# Patient Record
Sex: Female | Born: 1965 | Race: White | Hispanic: No | Smoking: Never smoker
Health system: Southern US, Community
[De-identification: ages and names within clinical notes are randomized; demographics above are authoritative.]

## PROBLEM LIST (undated history)

## (undated) DIAGNOSIS — I639 Cerebral infarction, unspecified: Secondary | ICD-10-CM

## (undated) DIAGNOSIS — E119 Type 2 diabetes mellitus without complications: Secondary | ICD-10-CM

---

## 2021-07-06 ENCOUNTER — Encounter (HOSPITAL_BASED_OUTPATIENT_CLINIC_OR_DEPARTMENT_OTHER): Payer: Self-pay | Admitting: Emergency Medicine

## 2021-07-06 ENCOUNTER — Emergency Department (HOSPITAL_BASED_OUTPATIENT_CLINIC_OR_DEPARTMENT_OTHER): Payer: Self-pay

## 2021-07-06 ENCOUNTER — Other Ambulatory Visit: Payer: Self-pay

## 2021-07-06 ENCOUNTER — Emergency Department (HOSPITAL_BASED_OUTPATIENT_CLINIC_OR_DEPARTMENT_OTHER)
Admission: EM | Admit: 2021-07-06 | Discharge: 2021-07-06 | Disposition: A | Discharge status: Home / Self Care | Payer: Self-pay | Attending: Emergency Medicine | Admitting: Emergency Medicine

## 2021-07-06 DIAGNOSIS — R1011 Right upper quadrant pain: Secondary | ICD-10-CM

## 2021-07-06 DIAGNOSIS — R1084 Generalized abdominal pain: Secondary | ICD-10-CM | POA: Insufficient documentation

## 2021-07-06 DIAGNOSIS — R112 Nausea with vomiting, unspecified: Secondary | ICD-10-CM | POA: Insufficient documentation

## 2021-07-06 DIAGNOSIS — R197 Diarrhea, unspecified: Secondary | ICD-10-CM | POA: Insufficient documentation

## 2021-07-06 DIAGNOSIS — E119 Type 2 diabetes mellitus without complications: Secondary | ICD-10-CM | POA: Insufficient documentation

## 2021-07-06 HISTORY — DX: Type 2 diabetes mellitus without complications: E11.9

## 2021-07-06 LAB — URINALYSIS, ROUTINE W REFLEX MICROSCOPIC
Bilirubin Urine: NEGATIVE
Glucose, UA: >=500 mg/dL — AB
Hgb urine dipstick: NEGATIVE
Ketones, ur: NEGATIVE mg/dL
Leukocytes,Ua: NEGATIVE
Nitrite: NEGATIVE
Protein, ur: NEGATIVE mg/dL
Specific Gravity, Urine: 1.015 (ref 1.005–1.030)
pH: 6 (ref 5.0–8.0)

## 2021-07-06 LAB — COMPREHENSIVE METABOLIC PANEL
ALT: 13 U/L (ref 0–44)
AST: 14 U/L — ABNORMAL LOW (ref 15–41)
Albumin: 3.9 g/dL (ref 3.5–5.0)
Alkaline Phosphatase: 90 U/L (ref 38–126)
Anion gap: 10 (ref 5–15)
BUN: 11 mg/dL (ref 6–20)
CO2: 27 mmol/L (ref 22–32)
Calcium: 9 mg/dL (ref 8.9–10.3)
Chloride: 96 mmol/L — ABNORMAL LOW (ref 98–111)
Creatinine, Ser: 0.59 mg/dL (ref 0.44–1.00)
GFR, Estimated: >60 mL/min (ref >60)
Glucose, Bld: 418 mg/dL — ABNORMAL HIGH (ref 70–99)
Potassium: 4.2 mmol/L (ref 3.5–5.1)
Sodium: 133 mmol/L — ABNORMAL LOW (ref 135–145)
Total Bilirubin: 0.5 mg/dL (ref 0.3–1.2)
Total Protein: 7.9 g/dL (ref 6.5–8.1)

## 2021-07-06 LAB — CBG MONITORING, ED
Glucose-Capillary: 407 mg/dL — ABNORMAL HIGH (ref 70–99)
Glucose-Capillary: 308 mg/dL — ABNORMAL HIGH (ref 70–99)

## 2021-07-06 LAB — URINALYSIS, MICROSCOPIC (REFLEX)

## 2021-07-06 LAB — CBC
HCT: 38.1 % (ref 36.0–46.0)
Hemoglobin: 13.1 g/dL (ref 12.0–15.0)
MCH: 30.3 pg (ref 26.0–34.0)
MCHC: 34.4 g/dL (ref 30.0–36.0)
MCV: 88.2 fL (ref 80.0–100.0)
Platelets: 331 10*3/uL (ref 150–400)
RBC: 4.32 MIL/uL (ref 3.87–5.11)
RDW: 11.4 % — ABNORMAL LOW (ref 11.5–15.5)
WBC: 10.7 10*3/uL — ABNORMAL HIGH (ref 4.0–10.5)
nRBC: 0 % (ref 0.0–0.2)

## 2021-07-06 LAB — LIPASE, BLOOD: Lipase: 18 U/L (ref 11–51)

## 2021-07-06 IMAGING — CR DG CHEST 2V
2 series · 2 of 2 positions shown · non-contrast
Comparison: None.

CLINICAL DATA: concern for post covid pna

EXAM:
CHEST - 2 VIEW

[w chest pa]
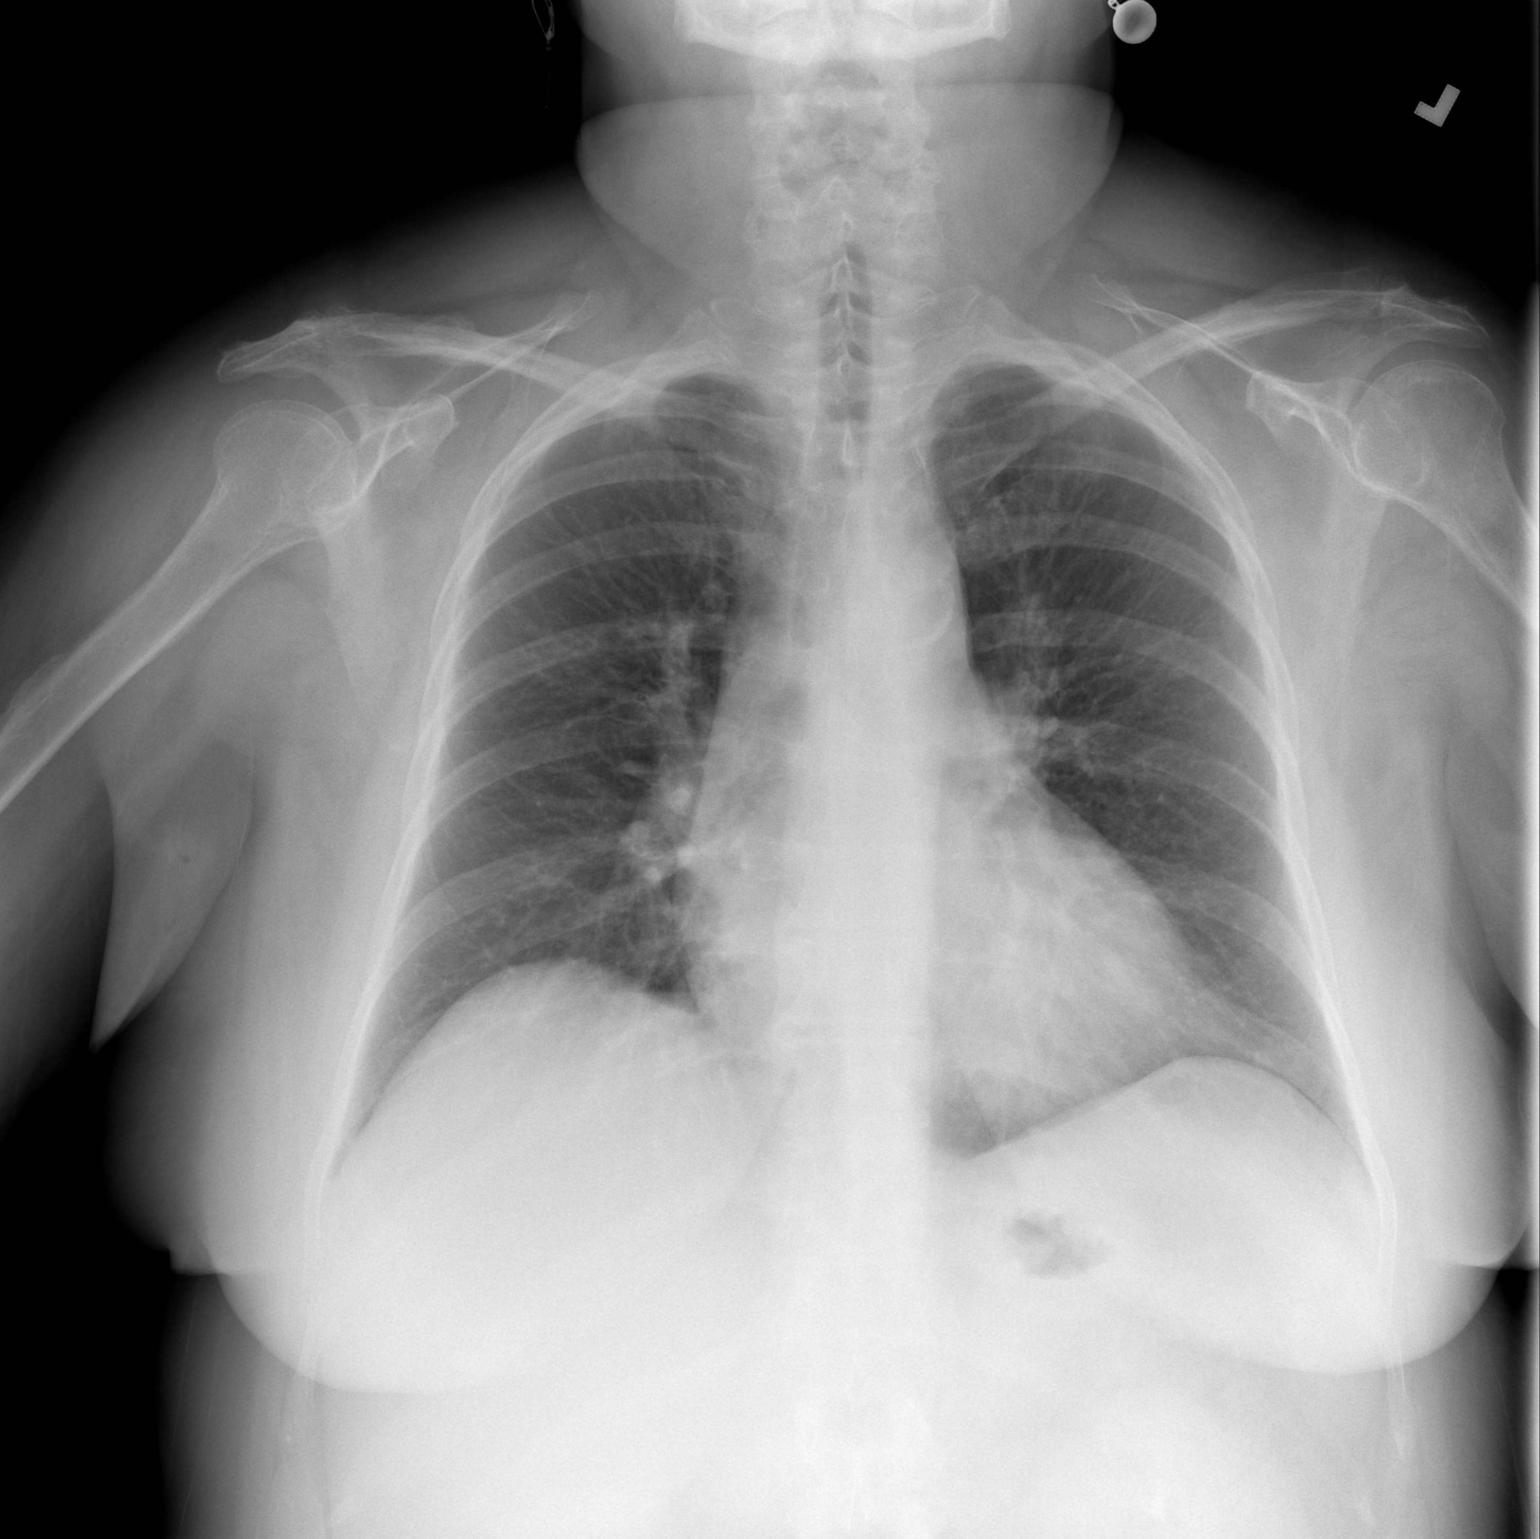

[w chest lat]
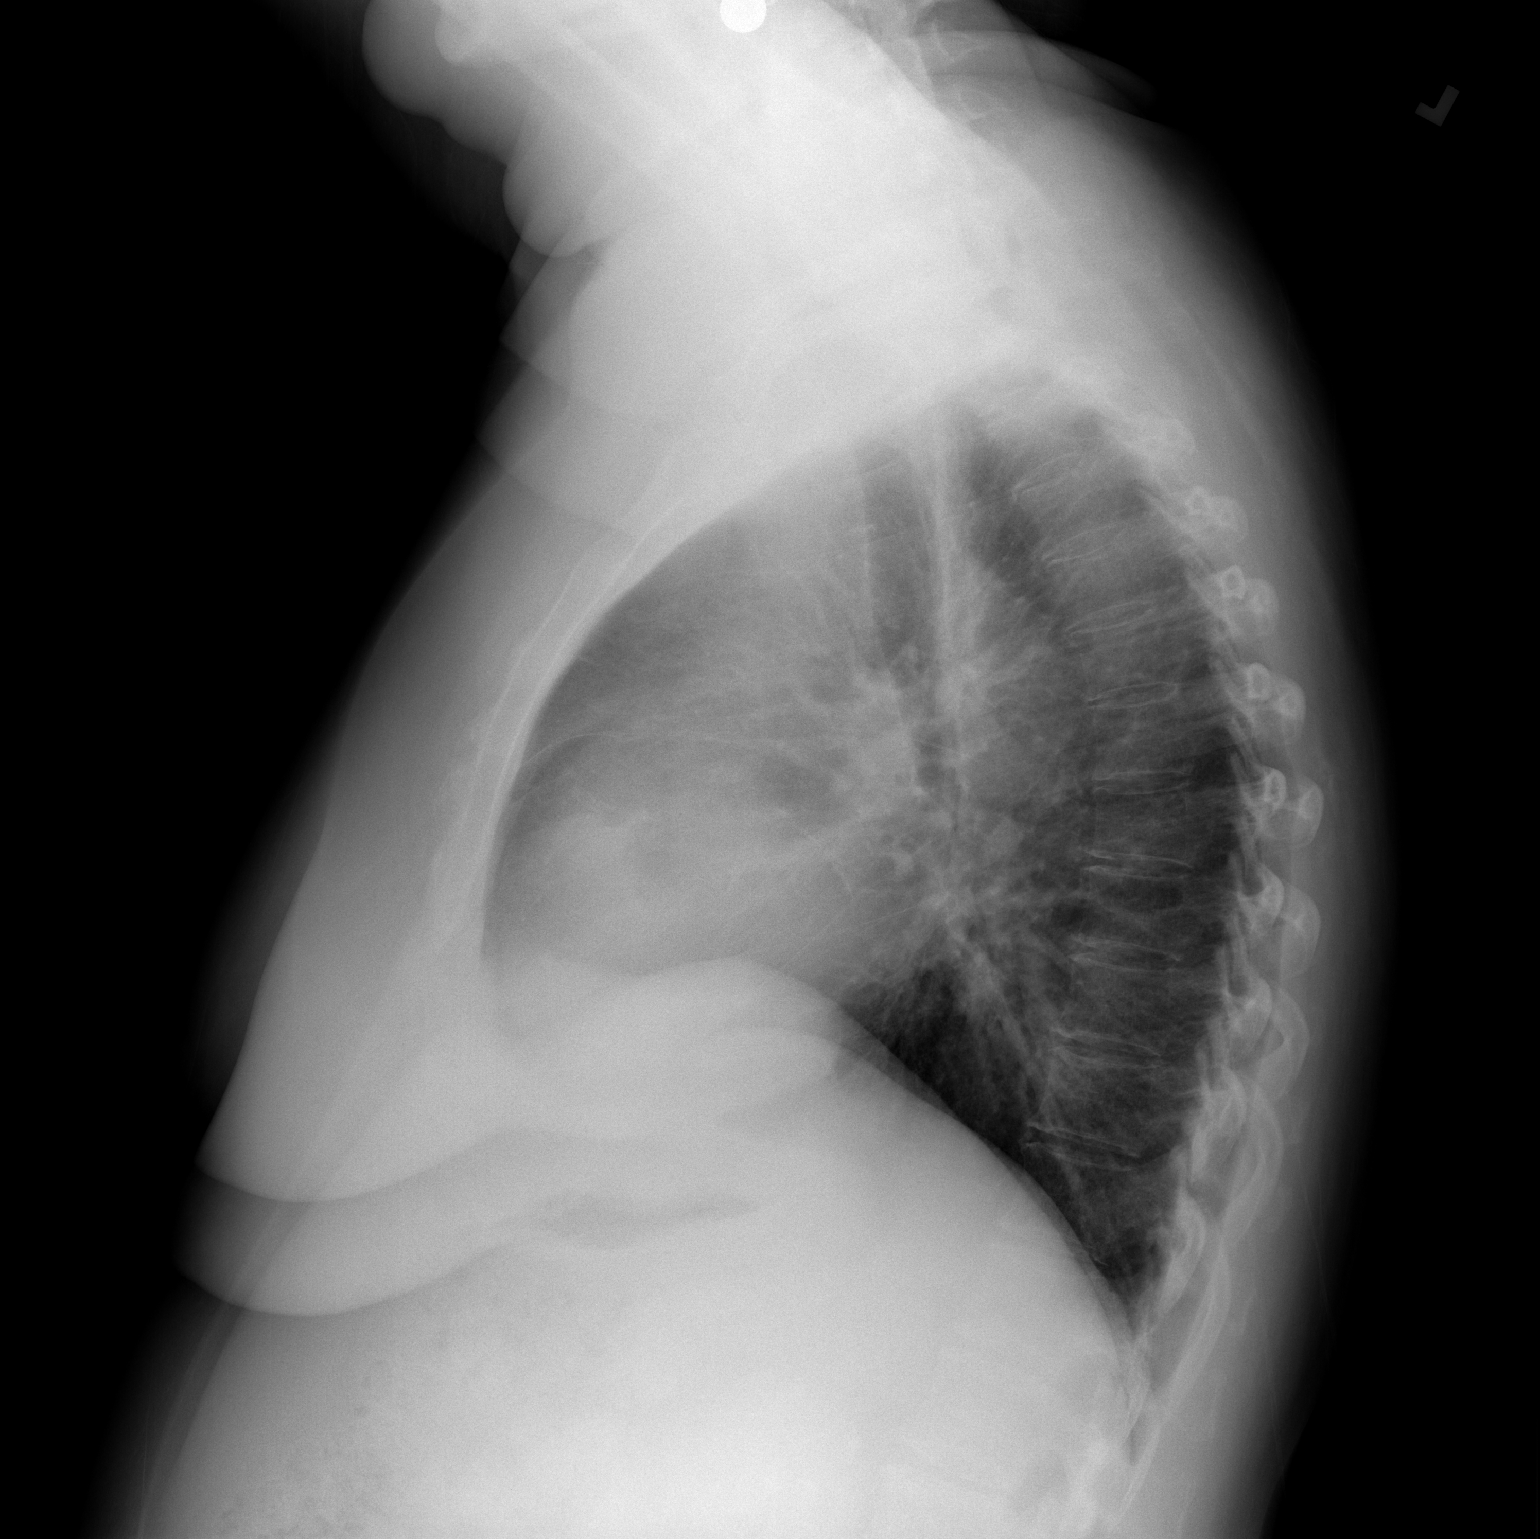

[2 of 2 positions shown; findings below may reference images not displayed]

FINDINGS: The cardiomediastinal silhouette is within normal limits. No pleural
effusion. No pneumothorax. No mass or consolidation. No acute
osseous abnormality.
IMPRESSION: No acute abnormality in the chest.  No consolidative pneumonia.

## 2021-07-06 IMAGING — CT CT ABD-PELV W/ CM
2 of 5 series · 16 of 46 positions shown, 18 images · IV contrast (Omnipaque)
Comparison: None.

CLINICAL DATA: Right upper quadrant abdominal pain.

EXAM:
CT ABDOMEN AND PELVIS WITH CONTRAST
TECHNIQUE: Multidetector CT imaging of the abdomen and pelvis was performed
using the standard protocol following bolus administration of
intravenous contrast.
CONTRAST:  85mL OMNIPAQUE IOHEXOL 350 MG/ML SOLN

[Series 2: axial st · axial · 0.91mm/px · z∈[-540,-85]mm · 13 of 103 slices shown, 15 images]
[im 6/103  soft-tissue]
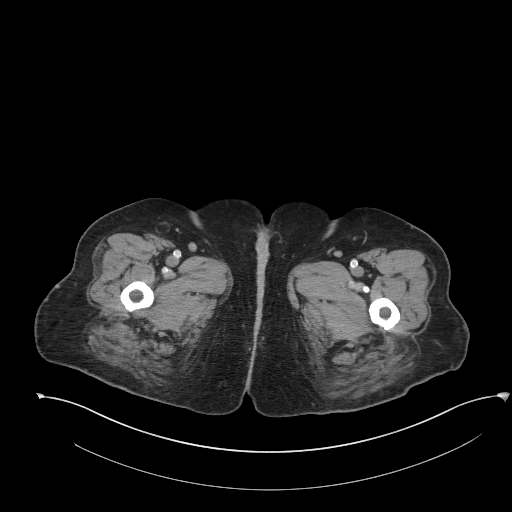
[im 6/103  bone]
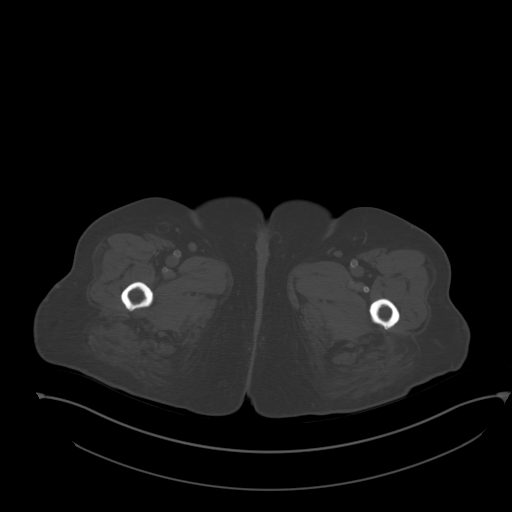
[im 17/103  soft-tissue]
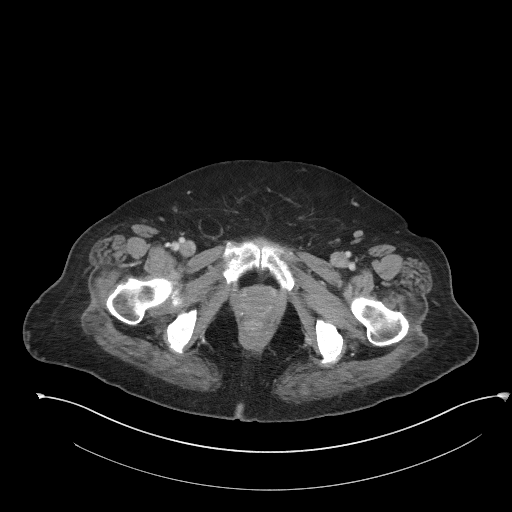
[im 22/103  soft-tissue]
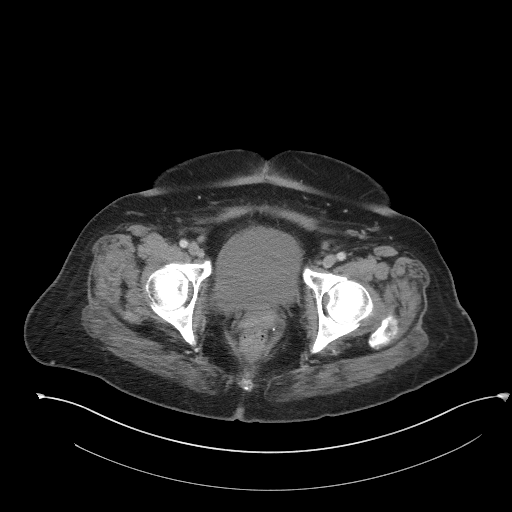
[im 27/103  soft-tissue]
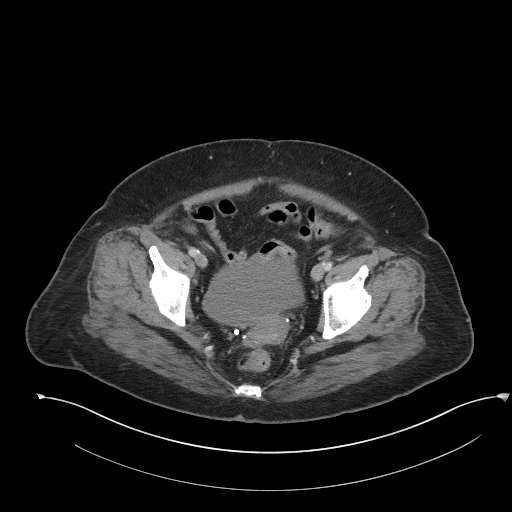
[im 38/103  soft-tissue]
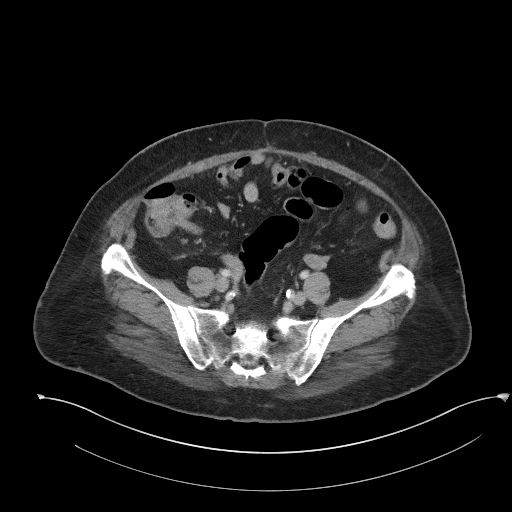
[im 43/103  soft-tissue]
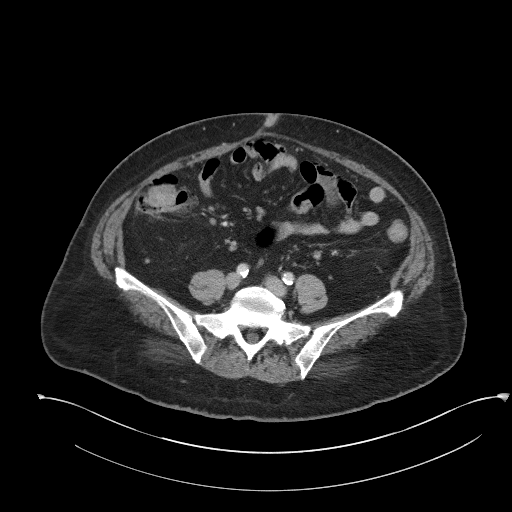
[im 54/103  soft-tissue]
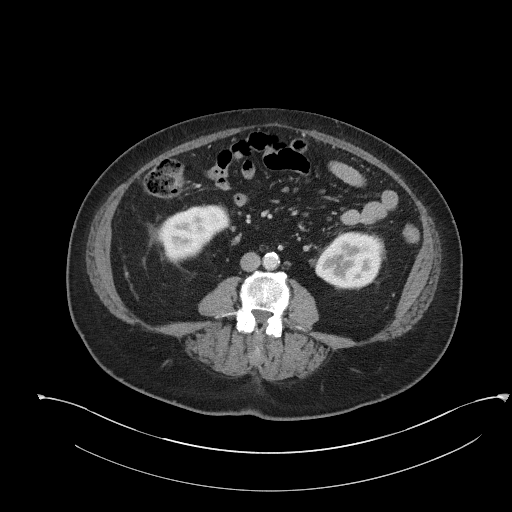
[im 60/103  soft-tissue]
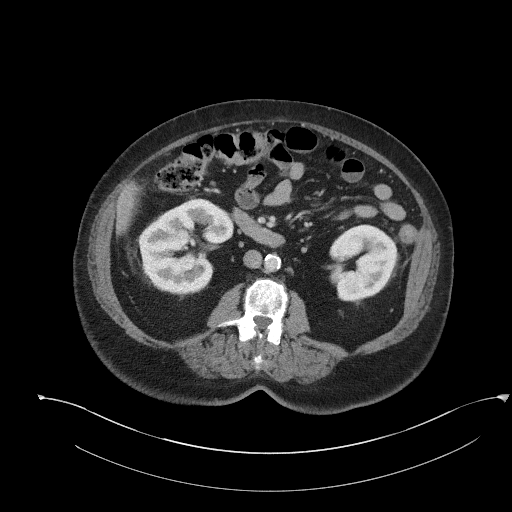
[im 65/103  soft-tissue]
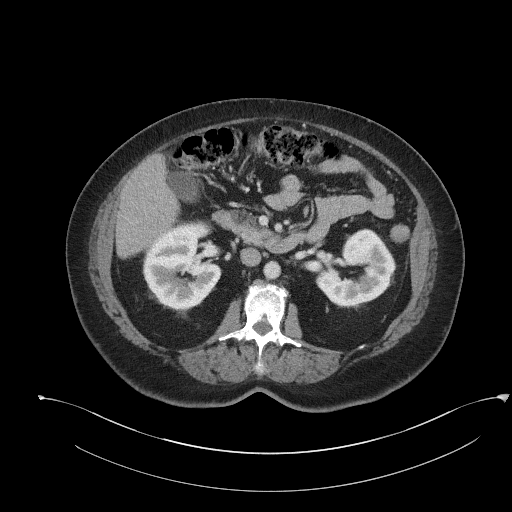
[im 65/103  bone]
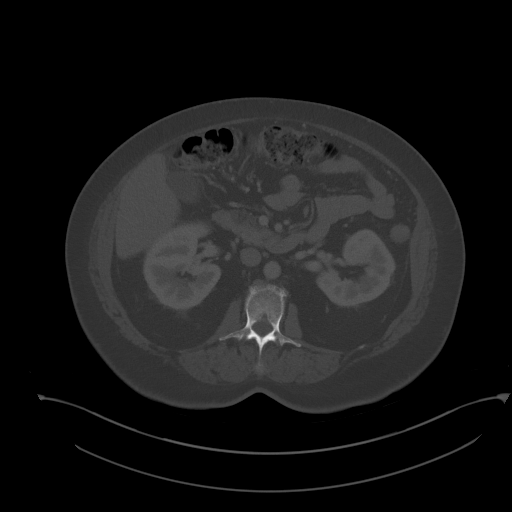
[im 76/103  soft-tissue]
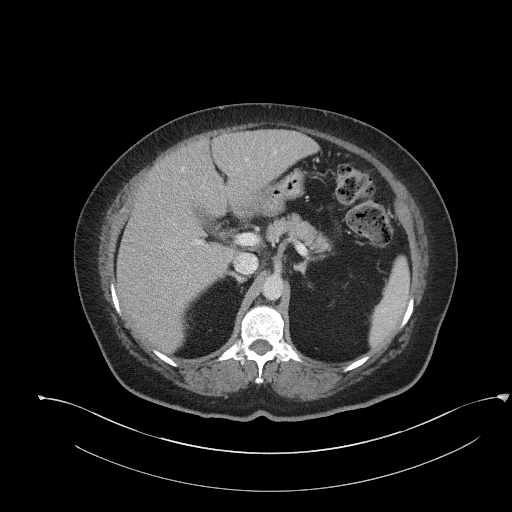
[im 81/103  soft-tissue]
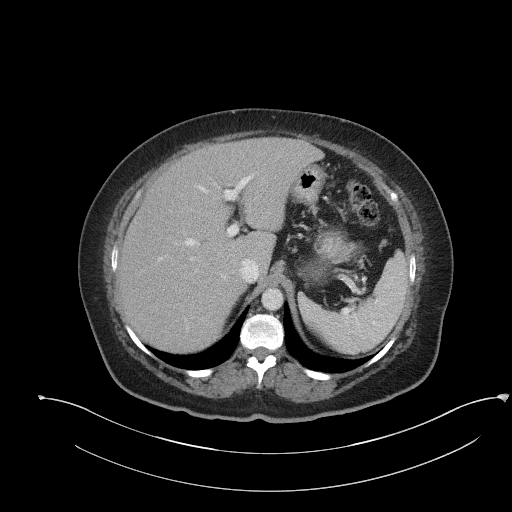
[im 86/103  soft-tissue]
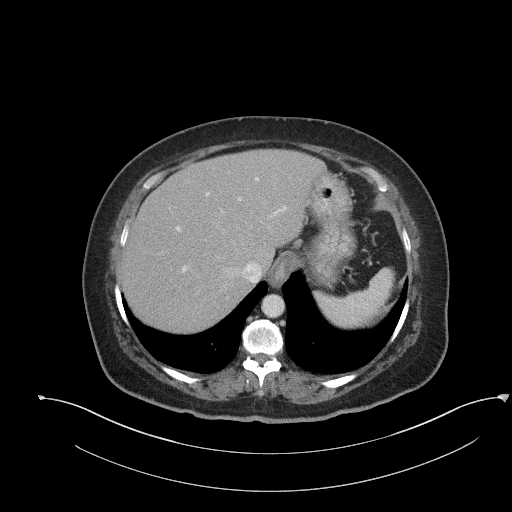
[im 97/103  soft-tissue]
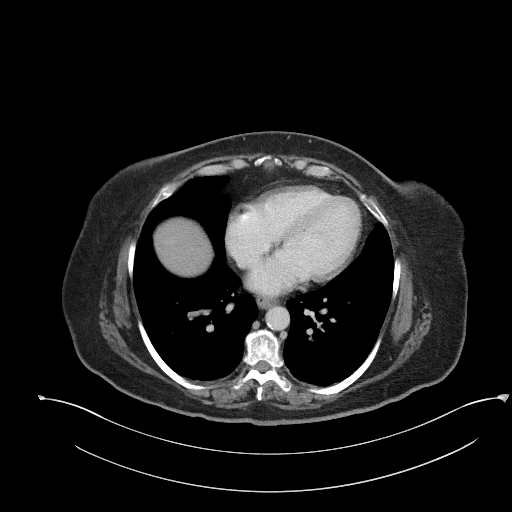

[Series 5: coronal st · coronal · 0.82mm/px · 3 of 100 slices shown]
[im 34/100  soft-tissue]
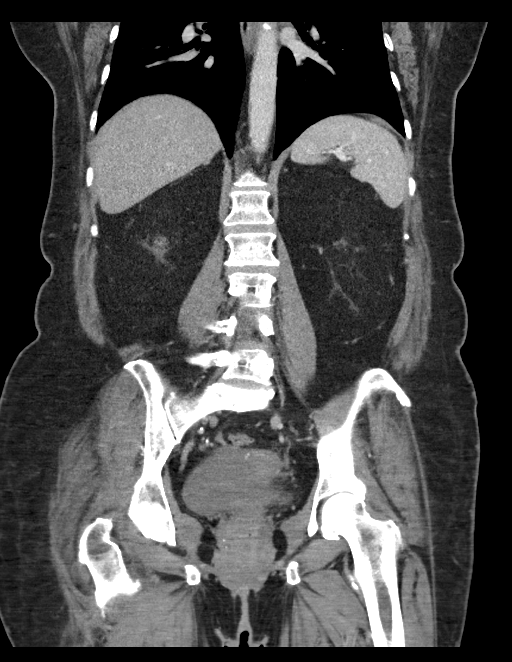
[im 45/100  soft-tissue]
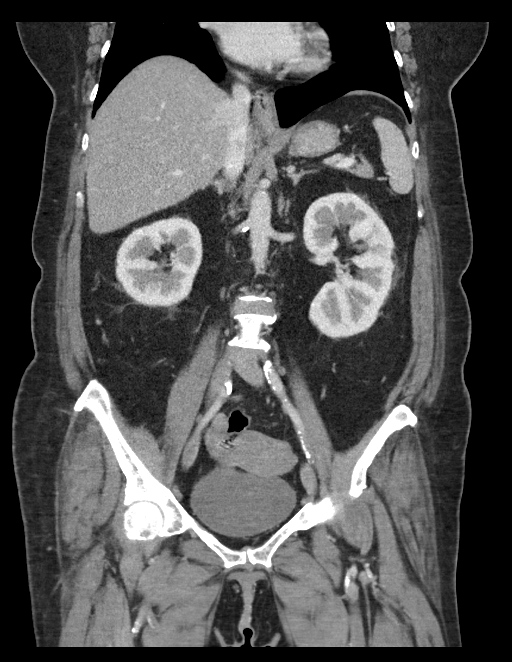
[im 56/100  soft-tissue]
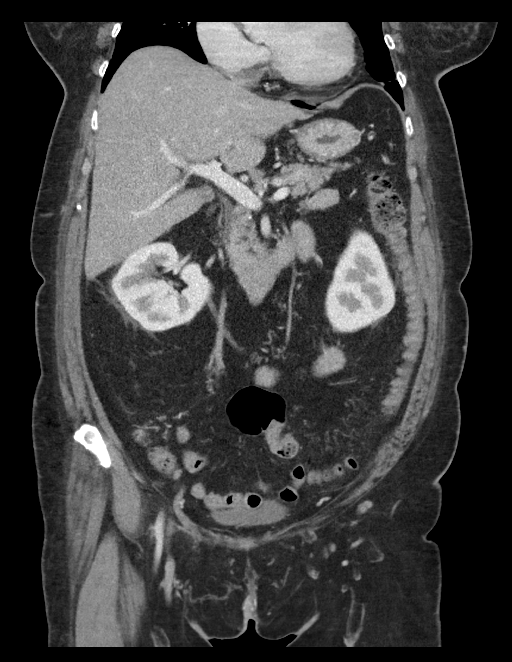

[16 of 46 positions shown; findings below may reference images not displayed]

FINDINGS: Lower chest: No acute abnormality.

Hepatobiliary: No focal liver abnormality is seen. No gallstones,
gallbladder wall thickening, or biliary dilatation.

Pancreas: Unremarkable. No pancreatic ductal dilatation or
surrounding inflammatory changes.

Spleen: Normal in size without focal abnormality.

Adrenals/Urinary Tract: Adrenal glands are unremarkable. Kidneys are
normal, without renal calculi, focal lesion, or hydronephrosis.
Bladder is unremarkable.

Stomach/Bowel: Stomach is within normal limits. Appendix appears
normal. No evidence of bowel wall thickening, distention, or
inflammatory changes. There is sigmoid colon diverticulosis without
evidence for acute diverticulitis.

Vascular/Lymphatic: Aortic atherosclerosis. No enlarged abdominal or
pelvic lymph nodes.

Reproductive: There surgical clips in the bilateral adnexa. Ovaries
and uterus are otherwise unremarkable.

Other: There is no ascites. There is a small right fat containing
inguinal hernia.

Musculoskeletal: No acute or significant osseous findings.
IMPRESSION: 1. No acute localizing process in the abdomen or pelvis.
2. Sigmoid colon diverticulosis without evidence for acute
diverticulitis.
3. Small right fat containing inguinal hernia.

## 2021-07-06 MED ORDER — ACETAMINOPHEN 325 MG PO TABS
650.0000 mg | ORAL_TABLET | Freq: Once | ORAL | Status: AC
  Administered 2021-07-06: 650 mg via ORAL
  Filled 2021-07-06: qty 2

## 2021-07-06 MED ORDER — INSULIN ASPART 100 UNIT/ML IJ SOLN
5.0000 [IU] | Freq: Once | INTRAMUSCULAR | Status: AC
  Administered 2021-07-06: 5 [IU] via SUBCUTANEOUS

## 2021-07-06 MED ORDER — METOCLOPRAMIDE HCL 5 MG/ML IJ SOLN
10.0000 mg | Freq: Once | INTRAMUSCULAR | Status: AC
  Administered 2021-07-06: 10 mg via INTRAVENOUS
  Filled 2021-07-06: qty 2

## 2021-07-06 MED ORDER — LACTATED RINGERS IV BOLUS
1000.0000 mL | Freq: Once | INTRAVENOUS | Status: AC
  Administered 2021-07-06: 1000 mL via INTRAVENOUS

## 2021-07-06 MED ORDER — INSULIN REGULAR HUMAN 100 UNIT/ML IJ SOLN: 5.0000 [IU] | Freq: Once | INTRAMUSCULAR | Status: DC

## 2021-07-06 MED ORDER — DICYCLOMINE HCL 20 MG PO TABS: 20.0000 mg | ORAL_TABLET | Freq: Two times a day (BID) | ORAL | 0 refills | Status: DC

## 2021-07-06 MED ORDER — DIPHENHYDRAMINE HCL 25 MG PO CAPS
25.0000 mg | ORAL_CAPSULE | Freq: Once | ORAL | Status: AC
  Administered 2021-07-06: 25 mg via ORAL
  Filled 2021-07-06: qty 1

## 2021-07-06 MED ORDER — IOHEXOL 350 MG/ML SOLN
100.0000 mL | Freq: Once | INTRAVENOUS | Status: AC | PRN
  Administered 2021-07-06: 85 mL via INTRAVENOUS

## 2021-07-06 MED ORDER — DICYCLOMINE HCL 10 MG PO CAPS
20.0000 mg | ORAL_CAPSULE | Freq: Once | ORAL | Status: AC
  Administered 2021-07-06: 20 mg via ORAL
  Filled 2021-07-06: qty 2

## 2021-07-06 MED ORDER — METOCLOPRAMIDE HCL 10 MG PO TABS: 10.0000 mg | ORAL_TABLET | Freq: Four times a day (QID) | ORAL | 0 refills | Status: DC

## 2021-07-06 NOTE — ED Provider Notes (Signed)
[facility] EMERGENCY DEPARTMENT Provider Note   CSN: [id number] Arrival date & time: 07/06/21  1117     History Chief Complaint  Patient presents with   Abdominal Pain    Savannah Conner is a 55 y.o. female.  HPI Patient is a 55 year old female with past medical history sniffing for DM2  She is presented to ER today with symptoms of nausea vomiting diarrhea abdominal pain, abdominal bloating, polyuria fatigue for the past 3 days.  She states that she was diagnosed with COVID-19 on 13th has not been on any antiviral medication states that she has been taking all of her medications as prescribed until 3 days ago she stop taking her metformin because she was having abdominal bloating and diarrhea already and did not wish to make this worse.  She states that her blood sugars have trended upwards until they reached mid 400s.  She endorses polyuria without dysuria hematuria. Also denies any vaginal discharge states that there is no blood in her stool denies any fevers she has taken no antipyretics either.  She has taken no other medications for her symptoms  No chest pain or shortness of breath.     Past Medical History:  Diagnosis Date   Diabetes mellitus without complication (HCC)     There are no problems to display for this patient.   The histories are not reviewed yet. Please review them in the "History" navigator section and refresh this SmartLink.   OB History   No obstetric history on file.     No family history on file.  Social History   Tobacco Use   Smoking status: Never   Smokeless tobacco: Never  Substance Use Topics   Alcohol use: Never   Drug use: Never    Home Medications Prior to Admission medications   Medication Sig Start Date End Date Taking? Authorizing Provider  dicyclomine (BENTYL) 20 MG tablet Take 1 tablet (20 mg total) by mouth 2 (two) times daily. 07/06/21  Yes Myron Stankovich S, PA  metoCLOPramide (REGLAN) 10 MG tablet Take 1  tablet (10 mg total) by mouth every 6 (six) hours. 07/06/21  Yes Solon Augusta S, PA    Allergies    Codeine  Review of Systems   Review of Systems  Constitutional:  Positive for fatigue. Negative for chills and fever.  HENT:  Negative for congestion.   Eyes:  Negative for pain.  Respiratory:  Negative for cough and shortness of breath.   Cardiovascular:  Negative for chest pain and leg swelling.  Gastrointestinal:  Positive for abdominal pain, diarrhea, nausea and vomiting.  Genitourinary:  Negative for dysuria.  Musculoskeletal:  Negative for myalgias.  Skin:  Negative for rash.  Neurological:  Negative for dizziness and headaches.   Physical Exam Updated Vital Signs BP (!) 157/90   Pulse 76   Temp 98.5 F (36.9 C) (Oral)   Resp 16   Ht 5\' 2"  (1.575 m)   Wt 77.1 kg   SpO2 99%   BMI 31.09 kg/m   Physical Exam Vitals and nursing note reviewed.  Constitutional:      General: She is not in acute distress.    Appearance: She is obese.     Comments: Appears uncomfortable but in no acute distress.  Pleasant 55 year old female able answer questions properly follow commands.  Speaking in full sentences no tachypnea.  HENT:     Head: Normocephalic and atraumatic.     Nose: Nose normal.  Eyes:  General: No scleral icterus. Cardiovascular:     Rate and Rhythm: Normal rate and regular rhythm.     Pulses: Normal pulses.     Heart sounds: Normal heart sounds.  Pulmonary:     Effort: Pulmonary effort is normal. No respiratory distress.     Breath sounds: No wheezing.  Abdominal:     Palpations: Abdomen is soft.     Tenderness: There is abdominal tenderness.     Comments: Mild diffuse tenderness palpation of the lower abdomen.  No guarding or rebound.  Right lower quadrant seems to be more tender than left. No CVA tenderness.  Musculoskeletal:     Cervical back: Normal range of motion.     Right lower leg: No edema.     Left lower leg: No edema.     Comments:  Paravertebral muscular tenderness on both left and right side of midline lumbar spine.  No midline lumbar tenderness palpation no step-off deformity bruising.  Skin:    General: Skin is warm and dry.     Capillary Refill: Capillary refill takes less than 2 seconds.  Neurological:     Mental Status: She is alert. Mental status is at baseline.  Psychiatric:        Mood and Affect: Mood normal.        Behavior: Behavior normal.    ED Results / Procedures / Treatments   Labs (all labs ordered are listed, but only abnormal results are displayed) Labs Reviewed  COMPREHENSIVE METABOLIC PANEL - Abnormal; Notable for the following components:      Result Value   Sodium 133 (*)    Chloride 96 (*)    Glucose, Bld 418 (*)    AST 14 (*)    All other components within normal limits  CBC - Abnormal; Notable for the following components:   WBC 10.7 (*)    RDW 11.4 (*)    All other components within normal limits  URINALYSIS, ROUTINE W REFLEX MICROSCOPIC - Abnormal; Notable for the following components:   Glucose, UA >=500 (*)    All other components within normal limits  URINALYSIS, MICROSCOPIC (REFLEX) - Abnormal; Notable for the following components:   Bacteria, UA RARE (*)    All other components within normal limits  CBG MONITORING, ED - Abnormal; Notable for the following components:   Glucose-Capillary 407 (*)    All other components within normal limits  CBG MONITORING, ED - Abnormal; Notable for the following components:   Glucose-Capillary 308 (*)    All other components within normal limits  LIPASE, BLOOD    EKG None  Radiology DG Chest 2 View  Result Date: 07/06/2021 CLINICAL DATA:  concern for post covid pna EXAM: CHEST - 2 VIEW COMPARISON:  None. FINDINGS: The cardiomediastinal silhouette is within normal limits. No pleural effusion. No pneumothorax. No mass or consolidation. No acute osseous abnormality. IMPRESSION: No acute abnormality in the chest.  No consolidative  pneumonia. Electronically Signed   By: Olive Bass M.D.   On: 07/06/2021 15:36   CT ABDOMEN PELVIS W CONTRAST  Result Date: 07/06/2021 CLINICAL DATA:  Right upper quadrant abdominal pain. EXAM: CT ABDOMEN AND PELVIS WITH CONTRAST TECHNIQUE: Multidetector CT imaging of the abdomen and pelvis was performed using the standard protocol following bolus administration of intravenous contrast. CONTRAST:  61mL OMNIPAQUE IOHEXOL 350 MG/ML SOLN COMPARISON:  None. FINDINGS: Lower chest: No acute abnormality. Hepatobiliary: No focal liver abnormality is seen. No gallstones, gallbladder wall thickening, or biliary dilatation. Pancreas: Unremarkable.  No pancreatic ductal dilatation or surrounding inflammatory changes. Spleen: Normal in size without focal abnormality. Adrenals/Urinary Tract: Adrenal glands are unremarkable. Kidneys are normal, without renal calculi, focal lesion, or hydronephrosis. Bladder is unremarkable. Stomach/Bowel: Stomach is within normal limits. Appendix appears normal. No evidence of bowel wall thickening, distention, or inflammatory changes. There is sigmoid colon diverticulosis without evidence for acute diverticulitis. Vascular/Lymphatic: Aortic atherosclerosis. No enlarged abdominal or pelvic lymph nodes. Reproductive: There surgical clips in the bilateral adnexa. Ovaries and uterus are otherwise unremarkable. Other: There is no ascites. There is a small right fat containing inguinal hernia. Musculoskeletal: No acute or significant osseous findings. IMPRESSION: 1. No acute localizing process in the abdomen or pelvis. 2. Sigmoid colon diverticulosis without evidence for acute diverticulitis. 3. Small right fat containing inguinal hernia. Electronically Signed   By: Darliss Cheney M.D.   On: 07/06/2021 15:26    Procedures Procedures   Medications Ordered in ED Medications  lactated ringers bolus 1,000 mL (0 mLs Intravenous Stopped 07/06/21 1501)  acetaminophen (TYLENOL) tablet 650 mg (650  mg Oral Given 07/06/21 1330)  dicyclomine (BENTYL) capsule 20 mg (20 mg Oral Given 07/06/21 1330)  metoCLOPramide (REGLAN) injection 10 mg (10 mg Intravenous Given 07/06/21 1330)  diphenhydrAMINE (BENADRYL) capsule 25 mg (25 mg Oral Given 07/06/21 1330)  insulin aspart (novoLOG) injection 5 Units (5 Units Subcutaneous Given 07/06/21 1500)  iohexol (OMNIPAQUE) 350 MG/ML injection 100 mL (85 mLs Intravenous Contrast Given 07/06/21 1442)    ED Course  I have reviewed the triage vital signs and the nursing notes.  Pertinent labs & imaging results that were available during my care of the patient were reviewed by me and considered in my medical decision making (see chart for details).  Clinical Course as of 07/07/21 1633  Thu Jul 06, 2021  1535 CT abdomen pelvis with contrast without acute abnormality small fat-containing hernia in right lower abdomen and diverticulosis without diverticulitis. [WF]  1541 Chest x-ray unremarkable. [WF]  1541 CBC with very mild leukocytosis of 10.7.  No anemia. [WF]  1541 CMP notable for mild hypoNa likely due to hyperglycemia this corrects to normal with correction.  Normal kidney function. [WF]  1541 Urinalysis, Routine w reflex microscopic Urine, Clean Catch(!) [WF]  1541 Glucose, UA(!): >=500 [WF]  1541 Lipase has normalized of pancreatitis.   I personally reviewed all laboratory work and imaging.  Metabolic panel without any acute abnormality specifically kidney function within normal limits and no significant electrolyte abnormalities. CBC without significant leukocytosis or significant anemia.  [WF]    Clinical Course User Index [WF] Gailen Shelter, [place]   MDM Rules/Calculators/A&P                          Patient is a 55 year old female with a history of DM2 and she is 9 days s/p diagnosis of COVID-19 seems to have been dramatically improving with her symptoms without antiviral treatment she is vaccinated  She comes the ER today for the past 3 days of  symptoms of nausea vomiting diarrhea she denies any fevers at home she has not had any recent travel no recent antibiotic use she has not had any medication changes she has stopped taking her metformin because of her diarrhea and her blood sugar is decreased to 418 here in the ER  She is endorsing some polyuria but no other urinary symptoms  On physical exam patient does have some tenderness to palpation of the lower abdomen specifically the  right lower abdomen no guarding or rebound of a relatively low suspicion for acute appendicitis we will provide patient with fluids, analgesia, small dose of insulin and Bentyl as well as nausea medicine and reassess.  Patient CMP is notable for mild hyponatremia which corrects to normal with glucose correction.  Mild hyperchloremia anemia.  No anion gap.  Lipase within normal limits at pancreatitis urinalysis negative for nitrates or leukocytes doubt urinary tract infection she does have glucosuria consistent with her hyperglycemia likely calling her polyuria Mild leukocytosis 10.7 chest x-ray unremarkable.  On reassessment patient continues to have right lower quadrant abdominal tenderness with palpation.  Show decision-making rotation patient she is agreeable to CT imaging at this time.  CT abdomen pelvis shows diverticulosis without diverticulitis there is also a small right-sided fat-containing inguinal hernia no other abnormalities Agree of radiology read.  I personally reviewed all labs and imaging from his encounter.  On my reassessment patient states she feels no longer any nausea States that her pain is now almost completely resolved although she does have some mild tenderness in the right lower quadrant still With reassuring CT imaging labs and work-up today however significant improvement in symptoms we will discharge patient home with Benadryl and Reglan to take with Bentyl.  Suspect viral gastroenteritis.  Unlikely to be related to her COVID-19  since her other symptoms seem to be dramatically improving.  She will follow-up with PCP return precautions given and understood.  Patient ambulatory and tolerating p.o. at time of discharge.   Final Clinical Impression(s) / ED Diagnoses Final diagnoses:  Generalized abdominal pain  RUQ abdominal pain  Nausea vomiting and diarrhea    Rx / DC Orders ED Discharge Orders          Ordered    dicyclomine (BENTYL) 20 MG tablet  2 times daily        07/06/21 1626    metoCLOPramide (REGLAN) 10 MG tablet  Every 6 hours        07/06/21 1626             Solon Augusta [place], [place] 07/07/21 1652    Terald Sleeper, MD 07/08/21 [phone]

## 2021-07-06 NOTE — Discharge Instructions (Addendum)
The CT scan of your abdomen pelvis and your chest x-ray were reassuringly normal apart from a small fat-containing hernia in the right abdomen which is unlikely to be causing your symptoms currently  Your work-up today overall is without any significant abnormalities.  I am discharging home with medication called Reglan we will take with Benadryl as needed for headaches and nausea.  Please drink plenty of water.  Follow-up with your primary care provider return to the ER for any new or concerning symptoms. I have also prescribed you Bentyl for abdominal pain

## 2021-07-06 NOTE — ED Triage Notes (Signed)
Reports she was diagnosed with covid on the 13th.  Was better but now having abdominal pain, bloating, diarrhea.  Also reports cbg elevated at 483 at the UC.  Is a diabetic but has not taken metformin all week due to gi upset.

## 2022-01-29 ENCOUNTER — Emergency Department (HOSPITAL_COMMUNITY): Payer: Self-pay

## 2022-01-29 ENCOUNTER — Observation Stay (HOSPITAL_BASED_OUTPATIENT_CLINIC_OR_DEPARTMENT_OTHER): Payer: Self-pay

## 2022-01-29 ENCOUNTER — Other Ambulatory Visit: Payer: Self-pay

## 2022-01-29 ENCOUNTER — Encounter (HOSPITAL_COMMUNITY): Payer: Self-pay | Admitting: Infectious Diseases

## 2022-01-29 ENCOUNTER — Inpatient Hospital Stay (HOSPITAL_COMMUNITY)
Admission: EM | Admit: 2022-01-29 | Discharge: 2022-01-31 | DRG: 066 | Disposition: A | Discharge status: Home / Self Care | Payer: Self-pay | Source: Ambulatory Visit | Attending: Infectious Diseases | Admitting: Infectious Diseases

## 2022-01-29 DIAGNOSIS — E785 Hyperlipidemia, unspecified: Secondary | ICD-10-CM | POA: Diagnosis present

## 2022-01-29 DIAGNOSIS — I639 Cerebral infarction, unspecified: Secondary | ICD-10-CM | POA: Diagnosis present

## 2022-01-29 DIAGNOSIS — E1165 Type 2 diabetes mellitus with hyperglycemia: Secondary | ICD-10-CM | POA: Diagnosis present

## 2022-01-29 DIAGNOSIS — E11319 Type 2 diabetes mellitus with unspecified diabetic retinopathy without macular edema: Secondary | ICD-10-CM | POA: Diagnosis present

## 2022-01-29 DIAGNOSIS — Z79899 Other long term (current) drug therapy: Secondary | ICD-10-CM

## 2022-01-29 DIAGNOSIS — I6389 Other cerebral infarction: Secondary | ICD-10-CM

## 2022-01-29 DIAGNOSIS — T383X6A Underdosing of insulin and oral hypoglycemic [antidiabetic] drugs, initial encounter: Secondary | ICD-10-CM | POA: Diagnosis present

## 2022-01-29 DIAGNOSIS — R29700 NIHSS score 0: Secondary | ICD-10-CM | POA: Diagnosis present

## 2022-01-29 DIAGNOSIS — Z8249 Family history of ischemic heart disease and other diseases of the circulatory system: Secondary | ICD-10-CM

## 2022-01-29 DIAGNOSIS — I6329 Cerebral infarction due to unspecified occlusion or stenosis of other precerebral arteries: Principal | ICD-10-CM | POA: Diagnosis present

## 2022-01-29 DIAGNOSIS — Z833 Family history of diabetes mellitus: Secondary | ICD-10-CM

## 2022-01-29 DIAGNOSIS — R739 Hyperglycemia, unspecified: Secondary | ICD-10-CM

## 2022-01-29 DIAGNOSIS — R4701 Aphasia: Secondary | ICD-10-CM | POA: Diagnosis present

## 2022-01-29 DIAGNOSIS — Z803 Family history of malignant neoplasm of breast: Secondary | ICD-10-CM

## 2022-01-29 DIAGNOSIS — Z20822 Contact with and (suspected) exposure to covid-19: Secondary | ICD-10-CM | POA: Diagnosis present

## 2022-01-29 DIAGNOSIS — Z87891 Personal history of nicotine dependence: Secondary | ICD-10-CM

## 2022-01-29 DIAGNOSIS — Z7984 Long term (current) use of oral hypoglycemic drugs: Secondary | ICD-10-CM

## 2022-01-29 DIAGNOSIS — I10 Essential (primary) hypertension: Secondary | ICD-10-CM | POA: Diagnosis present

## 2022-01-29 LAB — I-STAT CHEM 8, ED
BUN: 21 mg/dL — ABNORMAL HIGH (ref 6–20)
Calcium, Ion: 1.1 mmol/L — ABNORMAL LOW (ref 1.15–1.40)
Chloride: 104 mmol/L (ref 98–111)
Creatinine, Ser: 0.5 mg/dL (ref 0.44–1.00)
Glucose, Bld: 486 mg/dL — ABNORMAL HIGH (ref 70–99)
HCT: 40 % (ref 36.0–46.0)
Hemoglobin: 13.6 g/dL (ref 12.0–15.0)
Potassium: 4 mmol/L (ref 3.5–5.1)
Sodium: 136 mmol/L (ref 135–145)
TCO2: 23 mmol/L (ref 22–32)

## 2022-01-29 LAB — COMPREHENSIVE METABOLIC PANEL
ALT: 15 U/L (ref 0–44)
AST: 13 U/L — ABNORMAL LOW (ref 15–41)
Albumin: 3.5 g/dL (ref 3.5–5.0)
Alkaline Phosphatase: 79 U/L (ref 38–126)
Anion gap: 9 (ref 5–15)
BUN: 19 mg/dL (ref 6–20)
CO2: 21 mmol/L — ABNORMAL LOW (ref 22–32)
Calcium: 8.8 mg/dL — ABNORMAL LOW (ref 8.9–10.3)
Chloride: 105 mmol/L (ref 98–111)
Creatinine, Ser: 0.69 mg/dL (ref 0.44–1.00)
GFR, Estimated: >60 mL/min (ref >60)
Glucose, Bld: 486 mg/dL — ABNORMAL HIGH (ref 70–99)
Potassium: 4 mmol/L (ref 3.5–5.1)
Sodium: 135 mmol/L (ref 135–145)
Total Bilirubin: 0.6 mg/dL (ref 0.3–1.2)
Total Protein: 6.9 g/dL (ref 6.5–8.1)

## 2022-01-29 LAB — RESP PANEL BY RT-PCR (FLU A&B, COVID) ARPGX2
Influenza A by PCR: NEGATIVE
Influenza B by PCR: NEGATIVE
SARS Coronavirus 2 by RT PCR: NEGATIVE

## 2022-01-29 LAB — ECHOCARDIOGRAM COMPLETE
Area-P 1/2: 3.39 cm2
Height: 62 in
S' Lateral: 2.2 cm
Weight: 2592 oz

## 2022-01-29 LAB — CBC
HCT: 39.4 % (ref 36.0–46.0)
Hemoglobin: 13.1 g/dL (ref 12.0–15.0)
MCH: 30.7 pg (ref 26.0–34.0)
MCHC: 33.2 g/dL (ref 30.0–36.0)
MCV: 92.3 fL (ref 80.0–100.0)
Platelets: 255 10*3/uL (ref 150–400)
RBC: 4.27 MIL/uL (ref 3.87–5.11)
RDW: 11.3 % — ABNORMAL LOW (ref 11.5–15.5)
WBC: 7.9 10*3/uL (ref 4.0–10.5)
nRBC: 0 % (ref 0.0–0.2)

## 2022-01-29 LAB — GLUCOSE, CAPILLARY: Glucose-Capillary: 320 mg/dL — ABNORMAL HIGH (ref 70–99)

## 2022-01-29 LAB — DIFFERENTIAL
Abs Immature Granulocytes: 0.03 10*3/uL (ref 0.00–0.07)
Basophils Absolute: 0 10*3/uL (ref 0.0–0.1)
Basophils Relative: 1 %
Eosinophils Absolute: 0.3 10*3/uL (ref 0.0–0.5)
Eosinophils Relative: 3 %
Immature Granulocytes: 0 %
Lymphocytes Relative: 28 %
Lymphs Abs: 2.2 10*3/uL (ref 0.7–4.0)
Monocytes Absolute: 0.3 10*3/uL (ref 0.1–1.0)
Monocytes Relative: 4 %
Neutro Abs: 5 10*3/uL (ref 1.7–7.7)
Neutrophils Relative %: 64 %

## 2022-01-29 LAB — I-STAT BETA HCG BLOOD, ED (MC, WL, AP ONLY): I-stat hCG, quantitative: <5 m[IU]/mL (ref <5)

## 2022-01-29 LAB — RAPID URINE DRUG SCREEN, HOSP PERFORMED
Amphetamines: NOT DETECTED
Barbiturates: NOT DETECTED
Benzodiazepines: NOT DETECTED
Cocaine: NOT DETECTED
Opiates: NOT DETECTED
Tetrahydrocannabinol: NOT DETECTED

## 2022-01-29 LAB — URINALYSIS, ROUTINE W REFLEX MICROSCOPIC
Bacteria, UA: NONE SEEN
Bilirubin Urine: NEGATIVE
Glucose, UA: >=500 mg/dL — AB
Hgb urine dipstick: NEGATIVE
Ketones, ur: NEGATIVE mg/dL
Nitrite: NEGATIVE
Protein, ur: NEGATIVE mg/dL
Specific Gravity, Urine: 1.044 — ABNORMAL HIGH (ref 1.005–1.030)
pH: 5 (ref 5.0–8.0)

## 2022-01-29 LAB — PROTIME-INR
INR: 0.9 (ref 0.8–1.2)
Prothrombin Time: 12.4 seconds (ref 11.4–15.2)

## 2022-01-29 LAB — CBG MONITORING, ED
Glucose-Capillary: 402 mg/dL — ABNORMAL HIGH (ref 70–99)
Glucose-Capillary: 350 mg/dL — ABNORMAL HIGH (ref 70–99)

## 2022-01-29 LAB — APTT: aPTT: 23 seconds — ABNORMAL LOW (ref 24–36)

## 2022-01-29 IMAGING — CT CT ANGIO HEAD-NECK (W OR W/O PERF)
2 of 7 series · 8 of 33 positions shown · IV contrast (APPLIED)
Comparison: Same-day noncontrast head CT

CLINICAL DATA: TIA

EXAM:
CT ANGIOGRAPHY HEAD AND NECK
TECHNIQUE: Multidetector CT imaging of the head and neck was performed using
the standard protocol during bolus administration of intravenous
contrast. Multiplanar CT image reconstructions and MIPs were
obtained to evaluate the vascular anatomy. Carotid stenosis
measurements (when applicable) are obtained utilizing NASCET
criteria, using the distal internal carotid diameter as the
denominator.

[Series 5: cta neck/head · axial · 0.47mm/px · z∈[-256,-146]mm · 2 of 165 slices shown]
[im 55/165  soft-tissue]
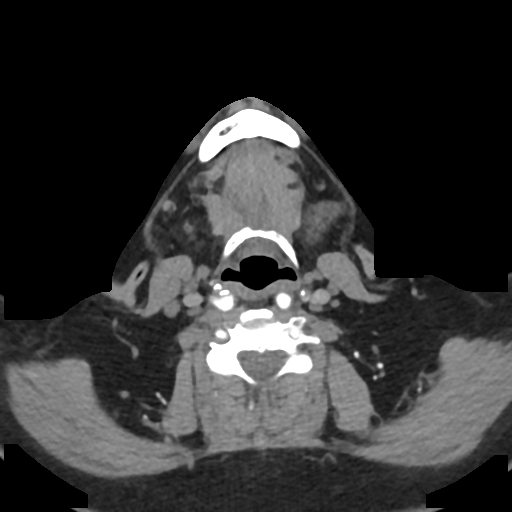
[im 110/165  soft-tissue]
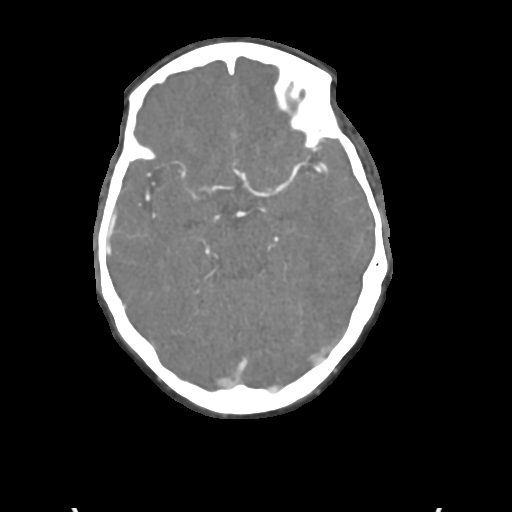

[Series 7: ax thins · axial · 0.39mm/px · z∈[-318,-82]mm · 6 of 330 slices shown]
[im 48/330  soft-tissue]
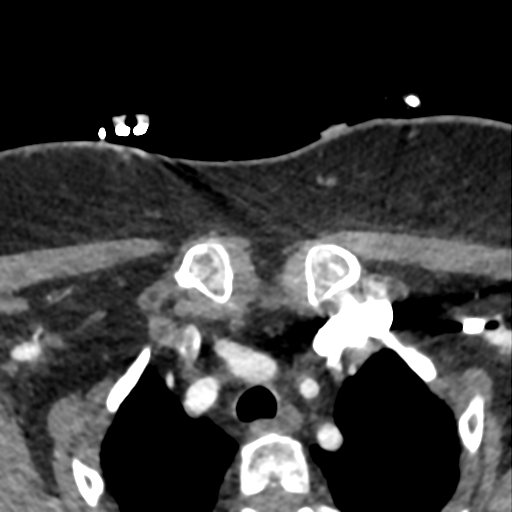
[im 95/330  bone]
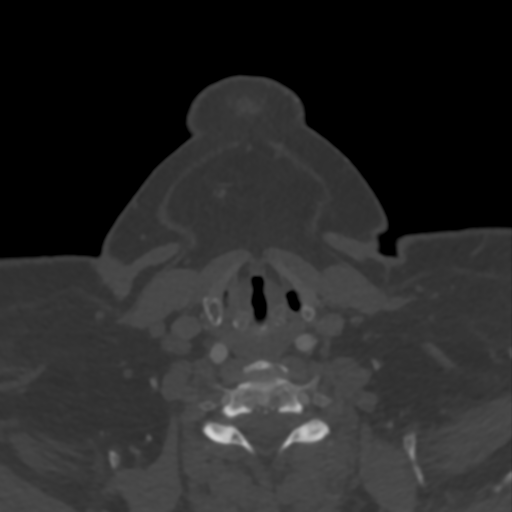
[im 142/330  soft-tissue]
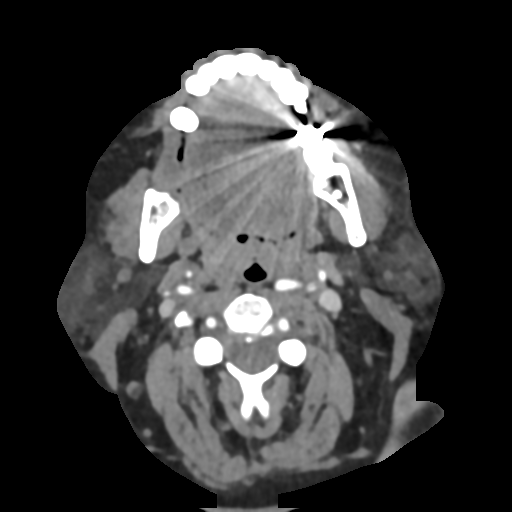
[im 189/330  bone]
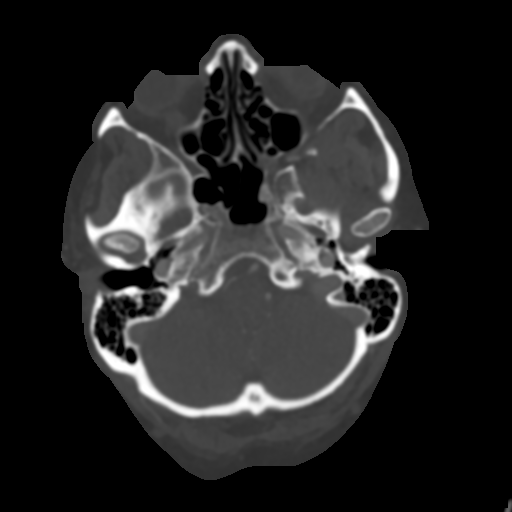
[im 236/330  soft-tissue]
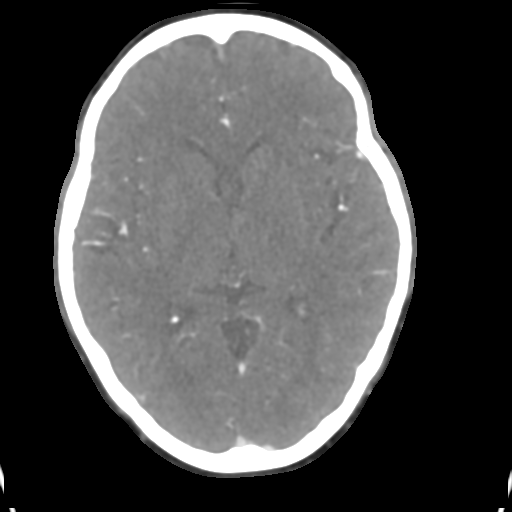
[im 283/330  bone]
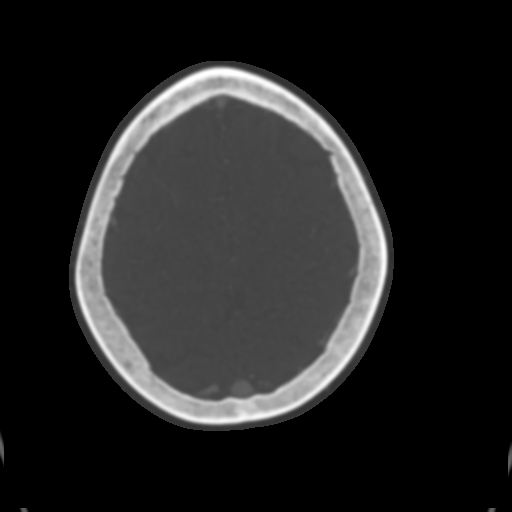

[8 of 33 positions shown; findings below may reference images not displayed]

RADIATION DOSE REDUCTION: This exam was performed according to the
departmental dose-optimization program which includes automated
exposure control, adjustment of the mA and/or kV according to
patient size and/or use of iterative reconstruction technique.

CONTRAST:  75mL OMNIPAQUE IOHEXOL 350 MG/ML SOLN
FINDINGS: CTA NECK FINDINGS

Aortic arch: There is mild calcified atherosclerotic plaque of the
aortic arch. The origins of the major branch vessels are patent. The
subclavian arteries are patent to the level imaged.

Right carotid system: The right common, internal, and external
carotid arteries are patent, without hemodynamically significant
stenosis or occlusion. There is no dissection or aneurysm.

Left carotid system: The left common carotid artery is patent. There
is mild mixed plaque at the bifurcation resulting in less than 50%
stenosis. The distal left internal carotid artery is patent. The
left external carotid artery is patent. There is no dissection or
aneurysm.

Vertebral arteries: The vertebral arteries are patent without
hemodynamically significant stenosis or occlusion. There is no
dissection or aneurysm.

Skeleton: There is no acute osseous abnormality or aggressive
osseous lesion. There is no visible canal hematoma.

Other neck: The soft tissues are unremarkable.

Upper chest: The imaged lung apices are clear.

Review of the MIP images confirms the above findings

CTA HEAD FINDINGS

Anterior circulation: There is calcified plaque in the bilateral
intracranial ICAs resulting in mild stenosis of the right cavernous
segment and moderate stenosis of the left supraclinoid segment.

The bilateral MCAs are patent

The bilateral ACAs are patent. The anterior communicating artery is
normal

There is no aneurysm or AVM.

Posterior circulation: The bilateral V4 segments are patent. There
is focal calcified plaque on the right without hemodynamically
significant stenosis. PICA is identified bilaterally. The basilar
artery is patent.

The bilateral PCAs are patent.

There is no aneurysm or AVM.

Venous sinuses: Patent.

Anatomic variants: None.

Review of the MIP images confirms the above findings
IMPRESSION: 1. No emergent large vessel occlusion.
2. Patent vasculature of the neck with mild mixed plaque at the left
carotid bifurcation resulting in less than 50% stenosis. No
significant stenosis on the right. Patent vertebral arteries.
3. Calcified plaque in the bilateral intracranial ICAs resulting in
mild stenosis on the right and moderate stenosis on the left.
Otherwise, patent vasculature of the head.

These findings were communicated to Dr. BINNY via text at [DATE]
p.m.

## 2022-01-29 IMAGING — MR MR HEAD W/O CM
6 of 10 series · 29 of 48 positions shown · non-contrast
Comparison: Same-day CT head

CLINICAL DATA: Expressive aphasia

EXAM:
MRI HEAD WITHOUT CONTRAST
TECHNIQUE: Multiplanar, multiecho pulse sequences of the brain and surrounding
structures were obtained without intravenous contrast.

[Series 2: DWI · axial · 3.0mm · 0.94mm/px · z∈[-97,+50]mm · 9 of 100 slices shown (1 of 2)]
[im 1/100]
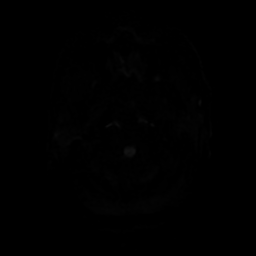
[im 13/100]
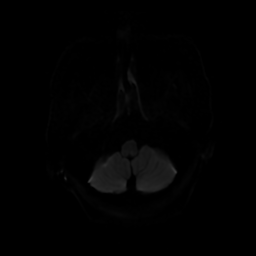
[im 25/100]
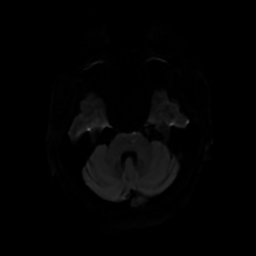
[im 38/100]
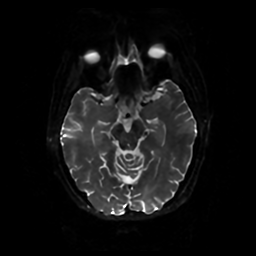
[im 50/100]
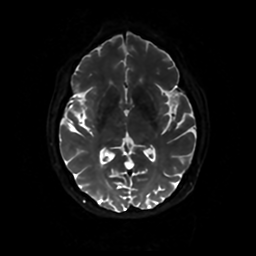
[im 62/100]
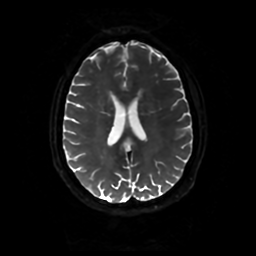
[im 75/100]
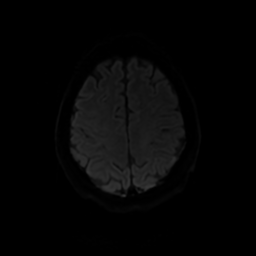
[im 87/100]
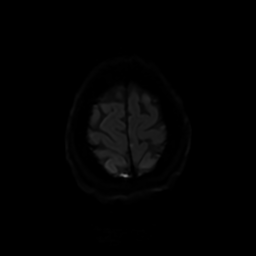
[im 100/100]
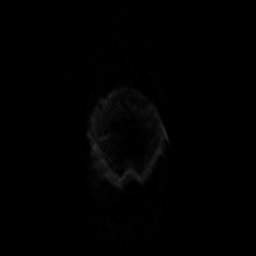

[Series 3: DWI · coronal · 4.0mm · 0.94mm/px · 7 of 73 slices shown (2 of 2)]
[im 1/73]
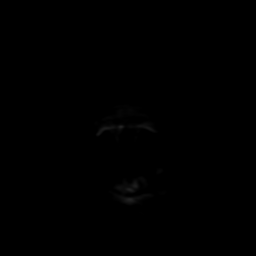
[im 13/73]
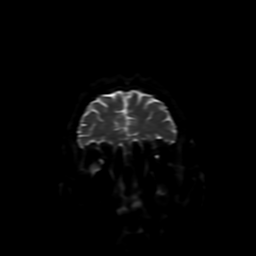
[im 25/73]
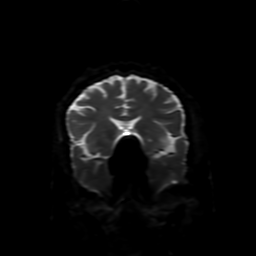
[im 37/73]
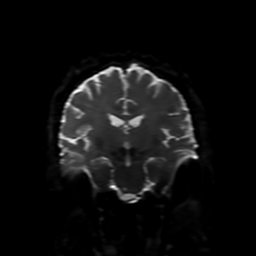
[im 49/73]
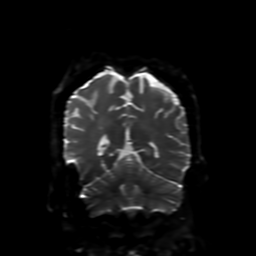
[im 61/73]
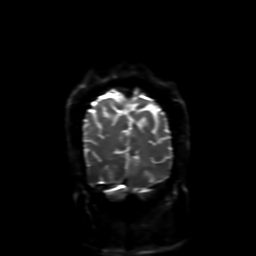
[im 73/73]
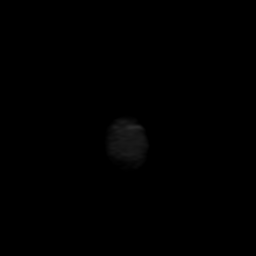

[Series 5: FLAIR · axial · 4.0mm · 0.47mm/px · z∈[-96,+49]mm · 3 of 34 slices shown (1 of 2)]
[im 1/34]
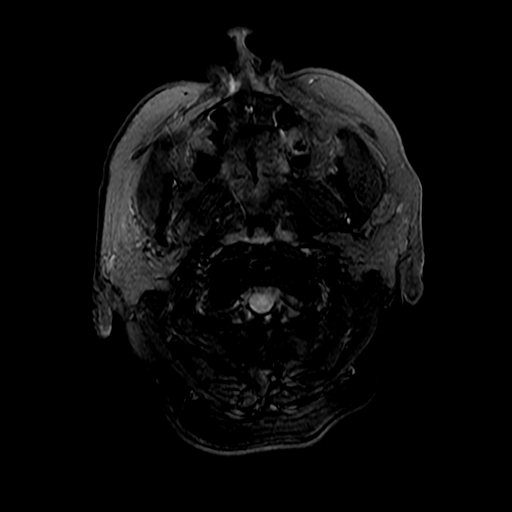
[im 17/34]
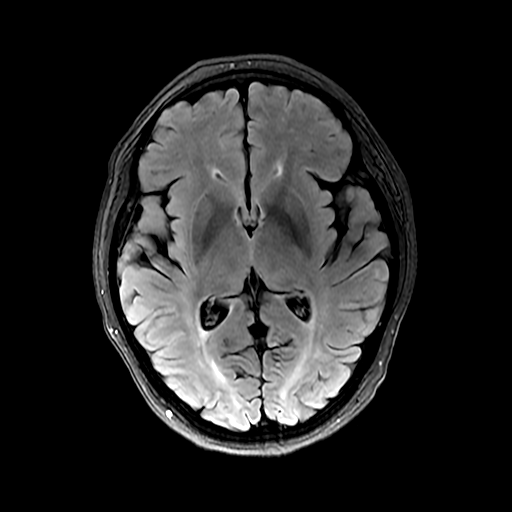
[im 34/34]
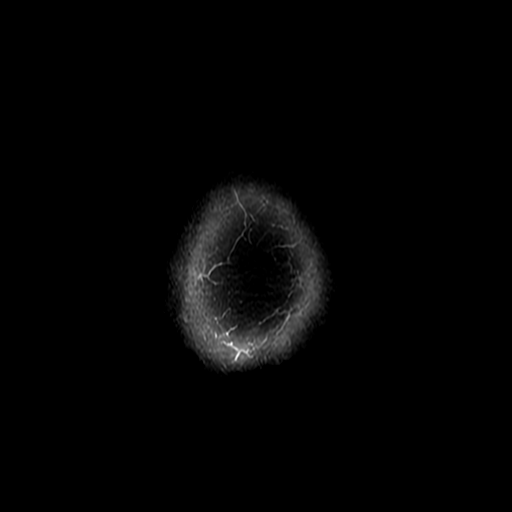

[Series 7: FLAIR · sagittal · 5.0mm · 0.23mm/px · 2 of 24 slices shown (2 of 2)]
[im 1/24]
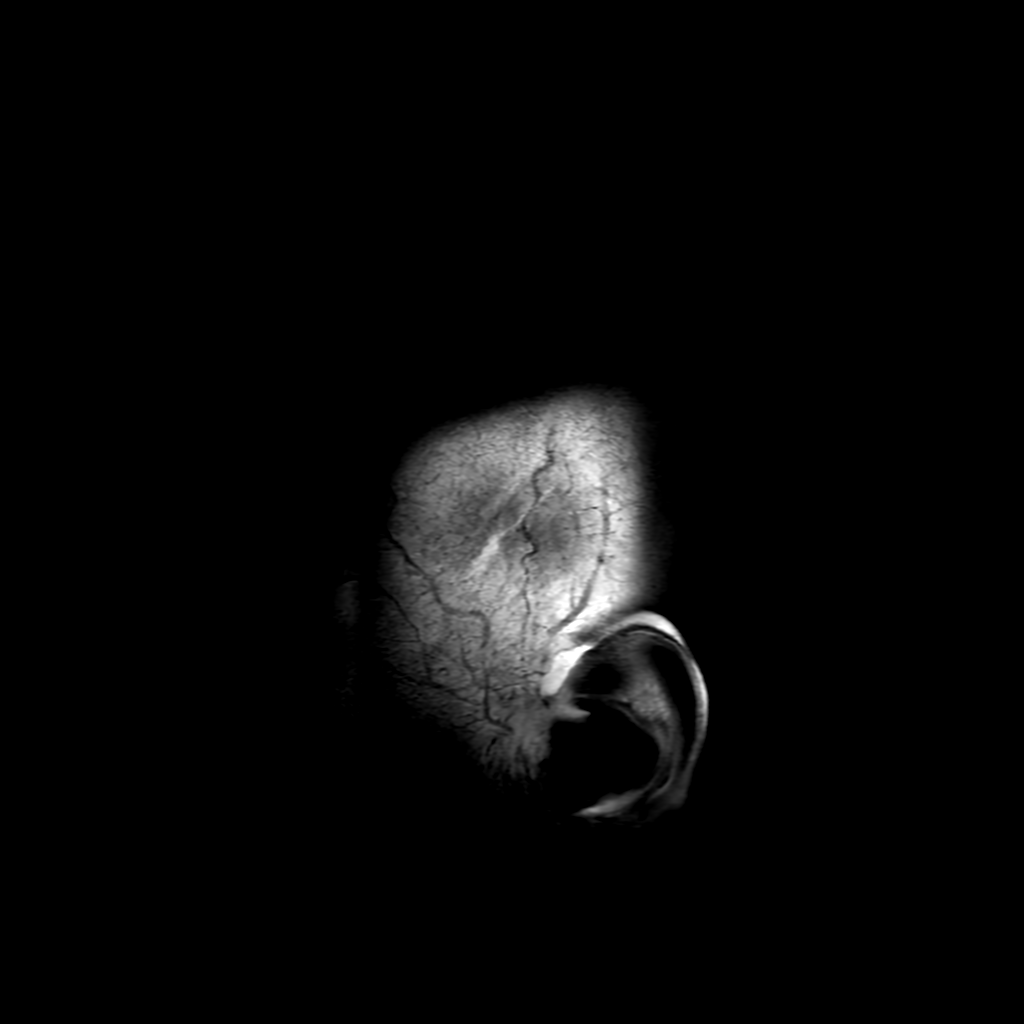
[im 24/24]
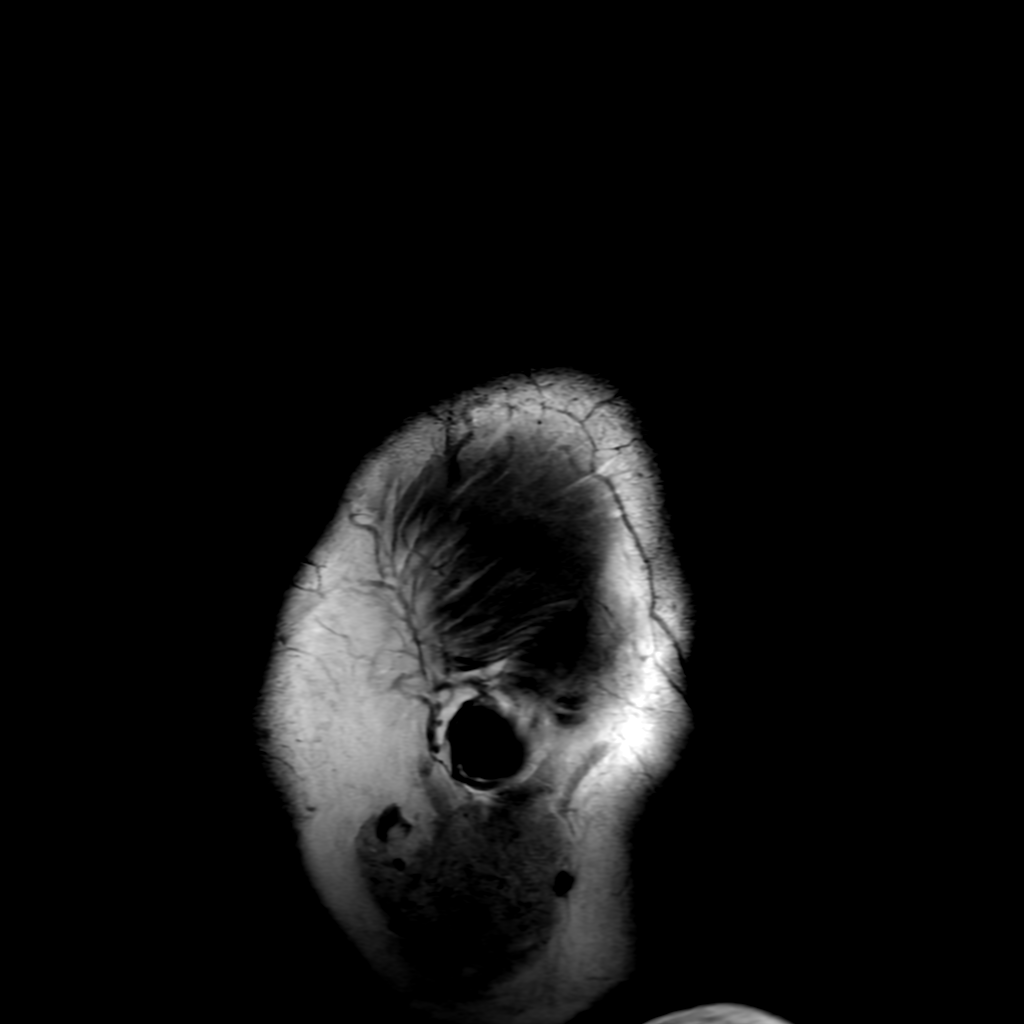

[Series 250: ADC · axial · 3.0mm · 0.94mm/px · z∈[-97,+50]mm · 5 of 50 slices shown (1 of 2)]
[im 1/50]
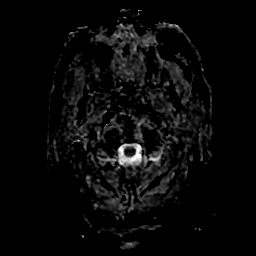
[im 13/50]
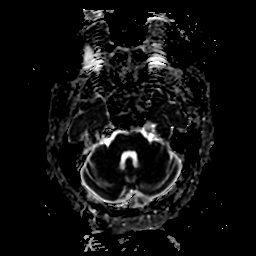
[im 25/50]
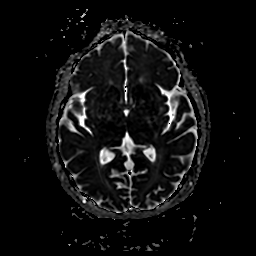
[im 37/50]
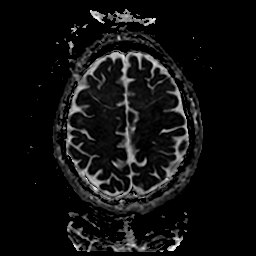
[im 50/50]
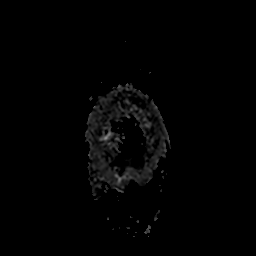

[Series 350: ADC · coronal · 4.0mm · 0.94mm/px · 3 of 34 slices shown (2 of 2)]
[im 1/34]
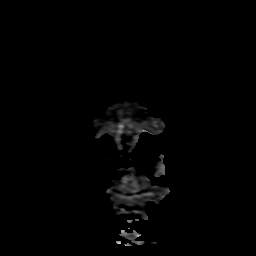
[im 17/34]
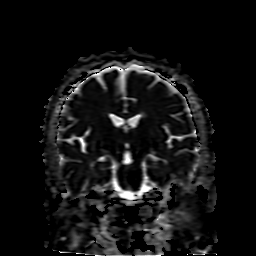
[im 34/34]
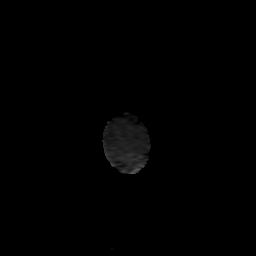

[29 of 48 positions shown; findings below may reference images not displayed]

FINDINGS: Brain: There are small foci of diffusion restriction in the left
pons with associated T2/FLAIR signal abnormality consistent with
acute infarct. There is no associated hemorrhage. There is no other
evidence of acute infarct. There is a small remote infarct in the
right pons.

There is no acute intracranial hemorrhage, or extra-axial fluid
collection.

Background parenchymal volume is normal. The ventricles are normal
in size. Scattered small foci of FLAIR signal abnormality in the
subcortical and periventricular white matter are nonspecific but
likely reflects sequela of mild chronic white matter
microangiopathy.

There is no solid mass lesion. There is no mass effect or midline
shift.

Vascular: Normal flow voids.

Skull and upper cervical spine: Normal marrow signal.

Sinuses/Orbits: There is mild mucosal thickening in the paranasal
sinuses. Bilateral lens implants are in place. The globes and orbits
are otherwise unremarkable.

Other: The adenoid tonsils are prominent.
IMPRESSION: 1. Small acute infarcts in the left pons with no associated
hemorrhage or mass effect.
2. Small remote infarct in the right pons and background mild
chronic white matter microangiopathy.
3. Prominent adenoid tonsils without focal lesion. Consider
nonemergent correlation with direct visualization.

## 2022-01-29 IMAGING — CT CT HEAD CODE STROKE
4 series · 16 of 47 positions shown, 18 images · non-contrast
Comparison: None.

CLINICAL DATA: Code stroke.



[Series 2: head wo · axial · 0.40mm/px · z∈[-172,-52]mm · 7 of 33 slices shown, 9 images]
[im 5/33  brain]
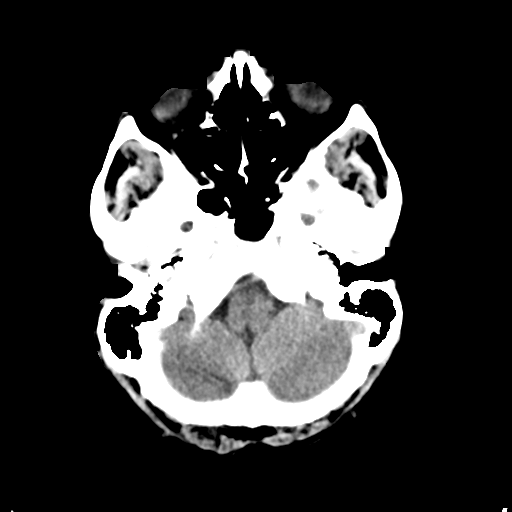
[im 5/33  bone]
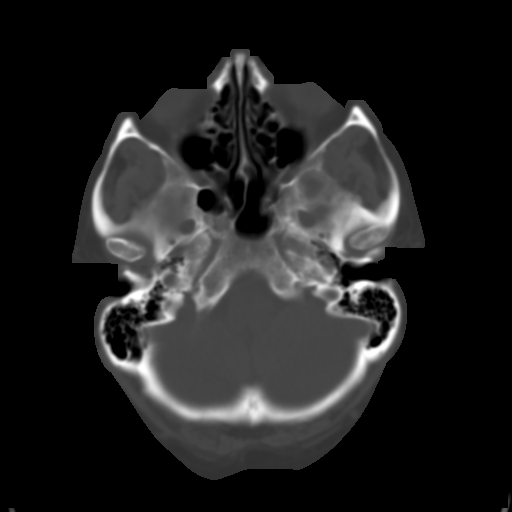
[im 9/33  brain]
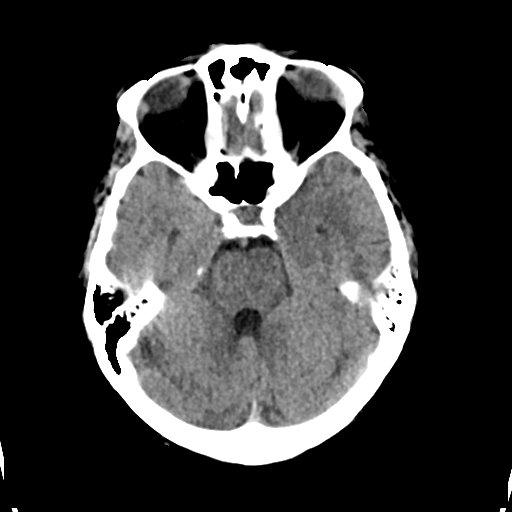
[im 13/33  brain]
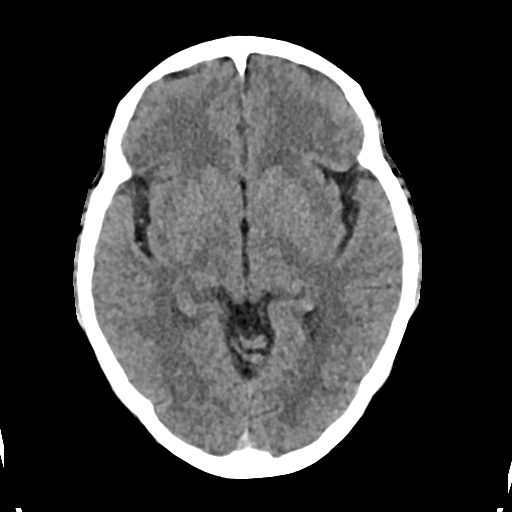
[im 17/33  brain]
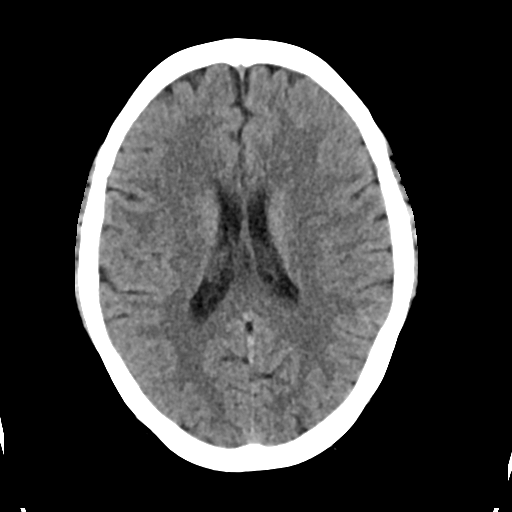
[im 21/33  brain]
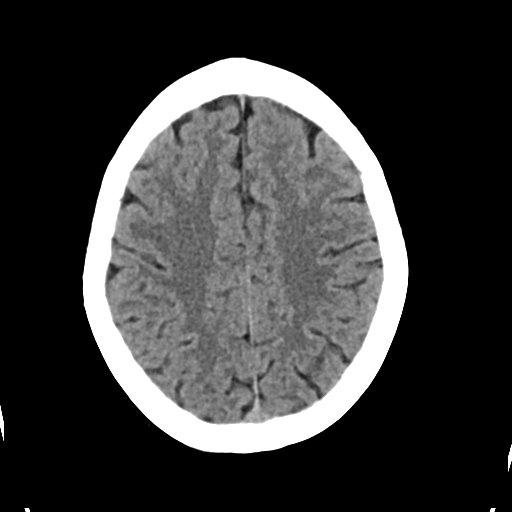
[im 21/33  bone]
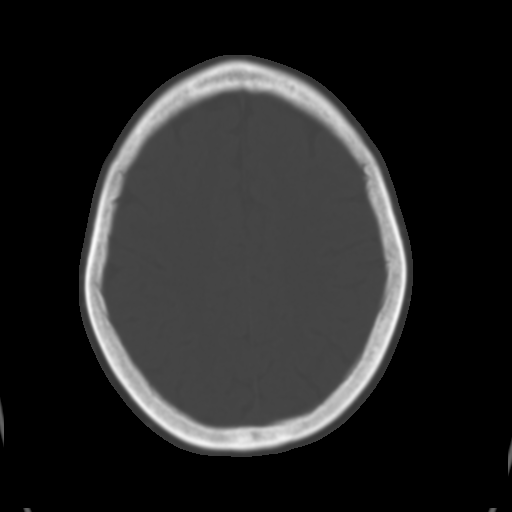
[im 25/33  brain]
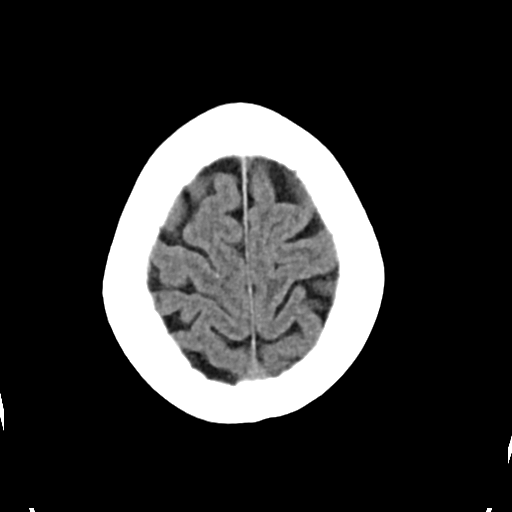
[im 29/33  brain]
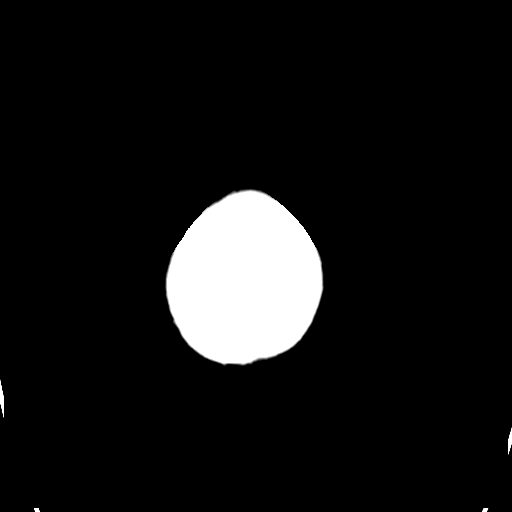

[Series 4: head bone · axial · 0.40mm/px · z∈[-176,-144]mm · 3 of 81 slices shown]
[im 9/81  bone]
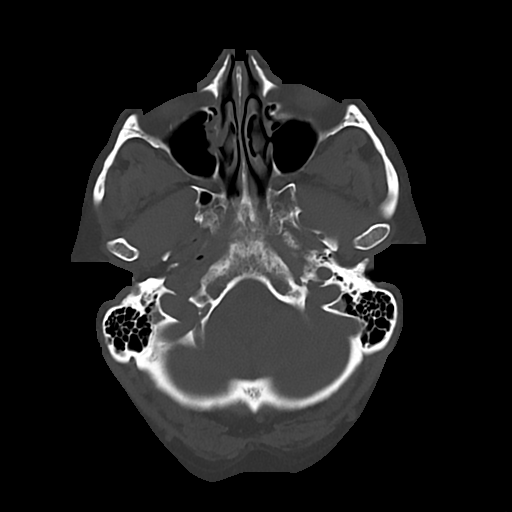
[im 17/81  bone]
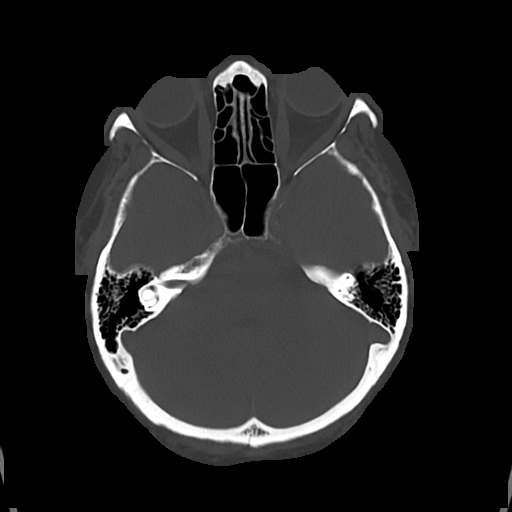
[im 25/81  bone]
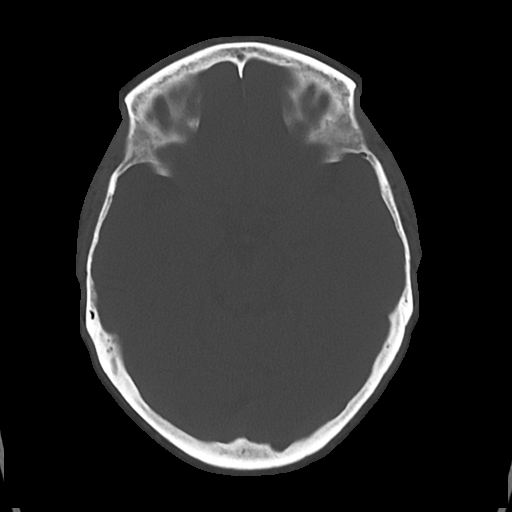

[Series 5: cor soft · coronal · 0.31mm/px · 3 of 64 slices shown]
[im 22/64  brain]
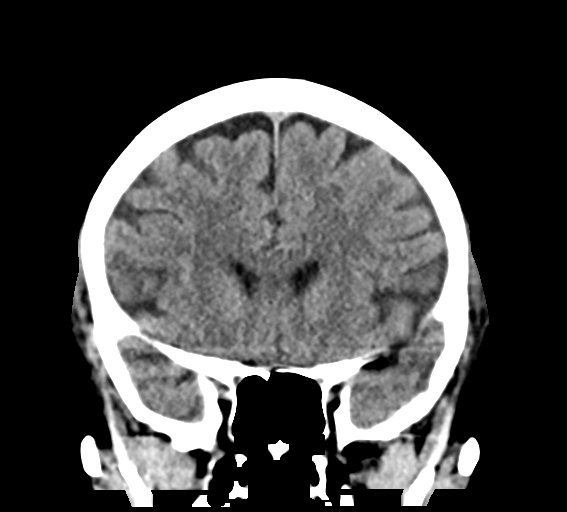
[im 29/64  brain]
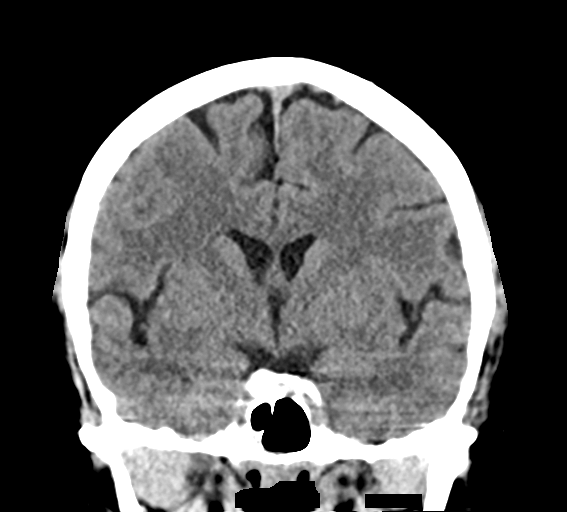
[im 36/64  brain]
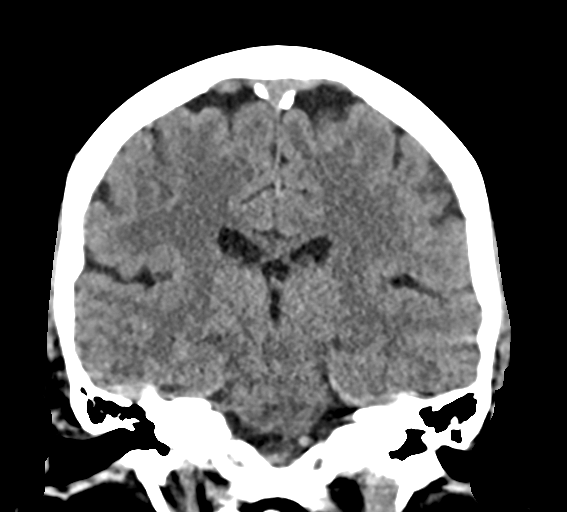

[Series 6: sag soft · sagittal · 0.31mm/px · 3 of 59 slices shown]
[im 20/59  brain]
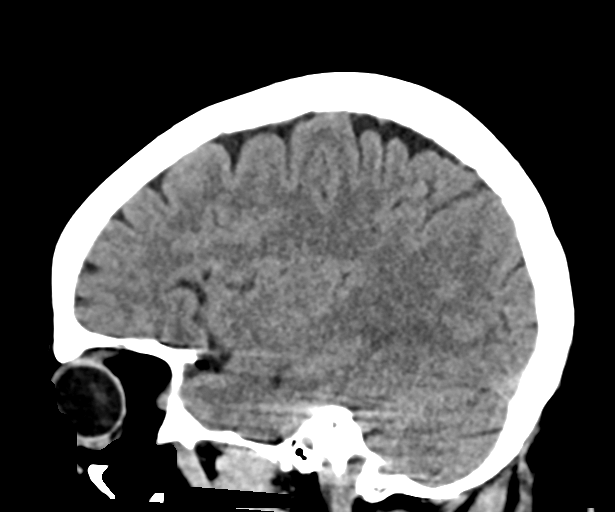
[im 30/59  brain]
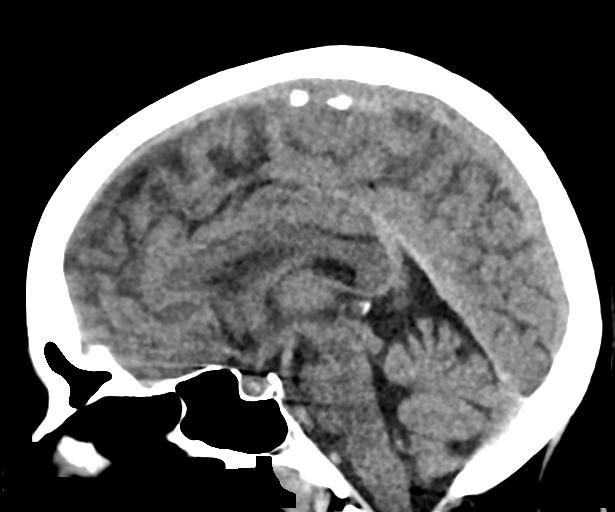
[im 39/59  brain]
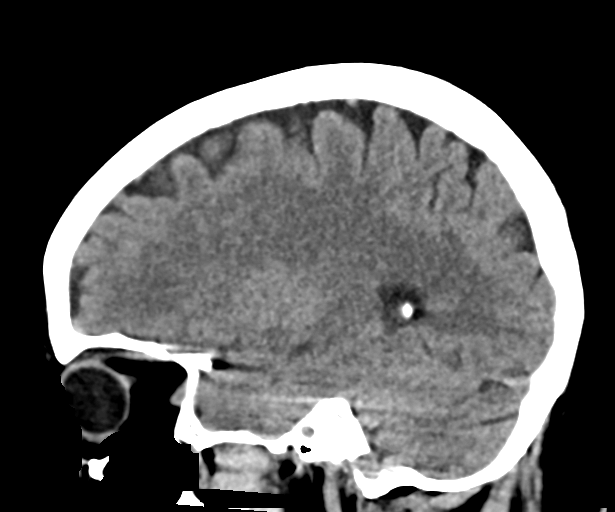

[16 of 47 positions shown; findings below may reference images not displayed]

FINDINGS: Brain: No evidence of acute large vascular territory infarction,
hemorrhage, hydrocephalus, extra-axial collection or mass
lesion/mass effect.

Vascular: No hyperdense vessel identified. Calcific intracranial
atherosclerosis.

Skull: No acute fracture.

Sinuses/Orbits: Mild mucosal thickening in the partially imaged left
inferior maxillary sinus. Otherwise, visualized sinuses are clear.
No acute orbital findings.

Other: No mastoid effusions.

ASPECTS ([place] Stroke Program Early CT Score) total score (0-10
with 10 being normal): 10.
IMPRESSION: No evidence of acute intracranial abnormality.  ASPECTS is 10.

pm to provider Dr. ARDA via secure text paging.

## 2022-01-29 MED ORDER — INSULIN GLARGINE-YFGN 100 UNIT/ML ~~LOC~~ SOLN
5.0000 [IU] | Freq: Every day | SUBCUTANEOUS | Status: DC
  Administered 2022-01-29: 5 [IU] via SUBCUTANEOUS
  Filled 2022-01-29 (×2): qty 0.05

## 2022-01-29 MED ORDER — INSULIN GLARGINE-YFGN 100 UNIT/ML ~~LOC~~ SOLN
5.0000 [IU] | Freq: Every day | SUBCUTANEOUS | Status: DC
  Filled 2022-01-29: qty 0.05

## 2022-01-29 MED ORDER — ASPIRIN EC 81 MG PO TBEC
81.0000 mg | DELAYED_RELEASE_TABLET | Freq: Every day | ORAL | Status: DC
  Administered 2022-01-30 – 2022-01-31 (×2): 81 mg via ORAL
  Filled 2022-01-29 (×2): qty 1

## 2022-01-29 MED ORDER — INSULIN ASPART 100 UNIT/ML IJ SOLN
0.0000 [IU] | Freq: Three times a day (TID) | INTRAMUSCULAR | Status: DC
  Administered 2022-01-29: 7 [IU] via SUBCUTANEOUS
  Administered 2022-01-30: 2 [IU] via SUBCUTANEOUS
  Administered 2022-01-30: 9 [IU] via SUBCUTANEOUS
  Administered 2022-01-30 – 2022-01-31 (×3): 5 [IU] via SUBCUTANEOUS

## 2022-01-29 MED ORDER — CLOPIDOGREL BISULFATE 75 MG PO TABS
75.0000 mg | ORAL_TABLET | Freq: Every day | ORAL | Status: DC
Start: 2022-01-29 — End: 2022-01-31
  Administered 2022-01-29 – 2022-01-31 (×3): 75 mg via ORAL
  Filled 2022-01-29 (×3): qty 1

## 2022-01-29 MED ORDER — ACETAMINOPHEN 325 MG PO TABS: 650.0000 mg | ORAL_TABLET | Freq: Four times a day (QID) | ORAL | Status: DC | PRN

## 2022-01-29 MED ORDER — SODIUM CHLORIDE 0.9 % IV BOLUS
500.0000 mL | Freq: Once | INTRAVENOUS | Status: AC
  Administered 2022-01-29: 500 mL via INTRAVENOUS

## 2022-01-29 MED ORDER — IOHEXOL 350 MG/ML SOLN
75.0000 mL | Freq: Once | INTRAVENOUS | Status: AC | PRN
  Administered 2022-01-29: 75 mL via INTRAVENOUS

## 2022-01-29 MED ORDER — ACETAMINOPHEN 650 MG RE SUPP: 650.0000 mg | Freq: Four times a day (QID) | RECTAL | Status: DC | PRN

## 2022-01-29 MED ORDER — STROKE: EARLY STAGES OF RECOVERY BOOK: Freq: Once | Status: AC

## 2022-01-29 MED ORDER — ENOXAPARIN SODIUM 40 MG/0.4ML IJ SOSY
40.0000 mg | PREFILLED_SYRINGE | INTRAMUSCULAR | Status: DC
  Administered 2022-01-29 – 2022-01-30 (×2): 40 mg via SUBCUTANEOUS
  Filled 2022-01-29 (×2): qty 0.4

## 2022-01-29 MED ORDER — SODIUM CHLORIDE 0.9 % IV SOLN
100.0000 mL/h | INTRAVENOUS | Status: DC
  Administered 2022-01-29 – 2022-01-30 (×3): 100 mL/h via INTRAVENOUS

## 2022-01-29 MED ORDER — ASPIRIN 325 MG PO TABS
325.0000 mg | ORAL_TABLET | Freq: Once | ORAL | Status: AC
  Administered 2022-01-29: 325 mg via ORAL
  Filled 2022-01-29: qty 1

## 2022-01-29 NOTE — Consult Note (Signed)
Neurology Consultation ? ?Reason for Consult: Code Stroke ?Referring Physician: Dr. Vanita Panda ? ?CC: expressive aphasia ? ?History is obtained from:patient  ? ?HPI: Savannah Conner is a 56 y.o. female with past medical history of HTN and DM, Diabetic retinopathy who presents to [facility] ED via EMS for expressive aphasia which had completely resolved on arrival to ED from  ophthalmologist for a scheduled appointment for her diabetic retinopathy. While in the ED she had acute onset of dysarthria which lasted for ~5 minutes and completely resolved. NIHSS 0.  ?Code stroke was called in the ED for new acute symptoms LKW 1140. She states she has had a headache for the last week which she contributed to allergies  ?No visual complaints that are new.  ?No tingling numbness or weakness. ?No CP, SOB, easy bleeding or bruising. ?Blood sugars in the 400s. ? ?LKW: 1140 ?tpa given?: no, resolved symptoms  ?Premorbid modified Rankin scale (mRS):  ?0-Completely asymptomatic and back to baseline post-stroke ? ? ?ROS: Full ROS was performed and is negative except as noted in the HPI.  ? ?Past Medical History:  ?Diagnosis Date  ? Diabetes mellitus without complication ([place])   ? ?Essential (primary) hypertension and Type 2 diabetes mellitus with hyperglycemia   ? ?No family history on file. ? ? ?Social History:  ? reports that she has never smoked. She has never used smokeless tobacco. She reports that she does not drink alcohol and does not use drugs. ? ?Medications ? ?Current Facility-Administered Medications:  ?  [COMPLETED] sodium chloride 0.9 % bolus 500 mL, 500 mL, Intravenous, Once, Last Rate: 500 mL/hr at 01/29/22 1151, 500 mL at 01/29/22 1151 **FOLLOWED BY** 0.9 %  sodium chloride infusion, 100 mL/hr, Intravenous, Continuous, Carmin Muskrat, MD, Last Rate: 100 mL/hr at 01/29/22 1152, 100 mL/hr at 01/29/22 1152 ? ?Current Outpatient Medications:  ?  dicyclomine (BENTYL) 20 MG tablet, Take 1 tablet (20 mg total) by mouth 2 (two)  times daily., Disp: 20 tablet, Rfl: 0 ?  metoCLOPramide (REGLAN) 10 MG tablet, Take 1 tablet (10 mg total) by mouth every 6 (six) hours., Disp: 30 tablet, Rfl: 0 ? ? ?Exam: ?Current vital signs: ?BP (!) 152/54 (BP Location: Right Arm)   Pulse 82   Temp 97.6 ?F (36.4 ?C) (Oral)   Resp 18   Ht 5\' 2"  (1.575 m)   Wt 73.5 kg   SpO2 97%   BMI 29.63 kg/m?  ?Vital signs in last 24 hours: ?Temp:  [97.6 ?F (36.4 ?C)] 97.6 ?F (36.4 ?C) (04/17 1123) ?Pulse Rate:  [82] 82 (04/17 1123) ?Resp:  [18] 18 (04/17 1123) ?BP: (152)/(54) 152/54 (04/17 1123) ?SpO2:  [97 %] 97 % (04/17 1123) ?Weight:  [73.5 kg] 73.5 kg (04/17 1200) ? ?GENERAL: Awake, alert in NAD ?HEENT: - Normocephalic and atraumatic, dry mm ?LUNGS - Clear to auscultation bilaterally with no wheezes ?CV - S1S2 RRR, no m/r/g, equal pulses bilaterally. ?ABDOMEN - Soft, nontender, nondistended with normoactive BS ?Ext: warm, well perfused, intact peripheral pulses, no edema ? ?NEURO:  ?Mental Status: AA&Ox4 ?Language: speech is clear.  Naming, repetition, fluency, and comprehension intact. ?Cranial Nerves: PERRL 49mm/brisk. EOMI, visual fields full, no facial asymmetry, facial sensation intact, hearing intact, tongue/uvula/soft palate midline, normal sternocleidomastoid and trapezius muscle strength. No evidence of tongue atrophy or fibrillations ?Motor: 5/5 in all 4 extremities  ?Tone: is normal and bulk is normal ?Sensation- Intact to light touch bilaterally ?Coordination: FTN intact bilaterally, no ataxia in BLE. ?Gait- deferred ? ?NIHSS-0 ? ? ?Labs ?I  have reviewed labs in epic and the results pertinent to this consultation are: ?CBC ?   ?Component Value Date/Time  ? WBC 7.9 01/29/2022 1140  ? RBC 4.27 01/29/2022 1140  ? HGB 13.6 01/29/2022 1151  ? HCT 40.0 01/29/2022 1151  ? PLT 255 01/29/2022 1140  ? MCV 92.3 01/29/2022 1140  ? MCH 30.7 01/29/2022 1140  ? MCHC 33.2 01/29/2022 1140  ? RDW 11.3 (L) 01/29/2022 1140  ? LYMPHSABS 2.2 01/29/2022 1140  ? MONOABS 0.3  01/29/2022 1140  ? EOSABS 0.3 01/29/2022 1140  ? BASOSABS 0.0 01/29/2022 1140  ? ? ?  Latest Ref Rng & Units 01/29/2022  ? 11:51 AM 01/29/2022  ? 11:40 AM 07/06/2021  ? 11:32 AM  ?BMP  ?Glucose 70 - 99 mg/dL 486   486   418    ?BUN 6 - 20 mg/dL 21   19   11     ?Creatinine 0.44 - 1.00 mg/dL 0.50   0.69   0.59    ?Sodium 135 - 145 mmol/L 136   135   133    ?Potassium 3.5 - 5.1 mmol/L 4.0   4.0   4.2    ?Chloride 98 - 111 mmol/L 104   105   96    ?CO2 22 - 32 mmol/L  21   27    ?Calcium 8.9 - 10.3 mg/dL  8.8   9.0    ? ? ? ? ?Imaging ?I have reviewed the images obtained: ? ?Code stroke CT-head 4/16: ?No evidence of acute intracranial abnormality.  ASPECTS is 10. ?  ?CTA head/neck 4/16: ?1. No emergent large vessel occlusion. ?2. Patent vasculature of the neck with mild mixed plaque at the left ?carotid bifurcation resulting in less than 50% stenosis. No ?significant stenosis on the right. Patent vertebral arteries. ?3. Calcified plaque in the bilateral intracranial ICAs resulting in ?mild stenosis on the right and moderate stenosis on the left. ?Otherwise, patent vasculature of the head ? ?MRI examination of the brain-done stat ?1. Small acute infarcts in the left pons with no associated hemorrhage or mass effect. ?2. Small remote infarct in the right pons and background mild ?chronic white matter microangiopathy. ?3. Prominent adenoid tonsils without focal lesion. Consider ?nonemergent correlation with direct visualization. ? ?Assessment:  ?Savannah Conner is a 56 y.o. female with past medical history of HTN and DM, Diabetic retinopathy who presents to [facility] ED via EMS for expressive aphasia which had completely resolved on arrival to ED from  ophthalmologist for a scheduled appointment for her diabetic retinopathy. While in the ED she had acute onset of dysarthria which lasted for ~5 minutes and completely resolved. NIHSS 0. Code stroke was called in the ED for new acute symptoms LKW 1140. She states she has had a headache  for the last week which she contributed to allergies  ?CT and CTA negative. ?Due to fluctuating symptoms, stat MRI was done that showed an acute left pontine infarct. Etiology likely small vessel disease in the setting of HTN/DM. ? ?Impression: ?Acute left pons ischemic infarct   ?History of diabetes-now with hyperglycemia ? ?Recommendations: ?- admit to hospitalist ?-Telemetry ?- HgbA1c, fasting lipid panel ?- Bedside swallow screen  ?- Frequent neuro checks ?- Echocardiogram ?- Prophylactic therapy-Antiplatelet med: Aspirin 325+plavix 75mg  now and daily ?- Risk factor modification ?- Telemetry monitoring ?- Permissive HTN - treat on a PRN basis if SBP>220 ?- PT consult, OT consult, Speech consult ?- Management of hyperglycemia per primary team ?- Stroke team to  follow  ? ? ?Attending Neurohospitalist Addendum ?Patient seen and examined with APP/Resident. ?Agree with the history and physical as documented above. ?Agree with the plan as documented, which I helped formulate. ?I have independently reviewed the chart, obtained history, review of systems and examined the patient.I have personally reviewed pertinent head/neck/spine imaging (CT/MRI). ?Please feel free to call with any questions. ? ?-- ?Amie Portland, MD ?Neurologist ?[facility] ?Pager: [phone] ? ? ?

## 2022-01-29 NOTE — H&P (Addendum)
? ? ? ?Date: 01-30-22     ?     ?     ?Patient Name:  Savannah Conner MRN: [id number]  ?DOB: May 29, 1966 Age / Sex: 56 y.o., female   ?PCP: Pcp, No    ?     ?Medical Service: Internal Medicine Teaching Service    ?     ?Attending Physician: Dr. Ninetta Lights, Lacretia Leigh, MD    ?First Contact: Dr. Lamar Sprinkles, MD Pager: [phone]  ?Second Contact: Dr. Quincy Simmonds, MD Pager: [phone]  ?     ?After Hours (After 5p/  First Contact Pager: [phone]  ?weekends / holidays): Second Contact Pager: [phone]  ? ?Chief Complaint: weakness and aphasia ? ?History of Present Illness: Savannah Conner is a 56 year old female with past medical history of uncontrolled type 2 diabetes c/b diabetic retinopathy and seasonal allergies who presented from ophthalmologist's office after acute onset of transient BLE weakness leading to difficulty ambulating and expressive aphasia. ? ?Patient's LKW was approx 1140. She was at eye doctors office this morning for laser treatment of R eye for diabetic retinopathy. She had difficulty ambulating upon standing and trouble forming words; she reports her lower extremities felt weak and apart from the rest of her body and that she knew what she wanted to say but difficulty with saying it.  This episode lasted 3 to 5 minutes.  At the time, BP and CBG was assessed, 210/65 and 156 respectively.  EMS was called, and upon arriving to the ED, she describes nonspecific lip swelling and a second episode of dysarthria lasting approximately 5 minutes. She reports frontal headaches behind both eyes ongoing x 1 week.  ROS positive for dizziness and nausea during the episode.  She denies recent cough, abdominal pain, dysuria, constipation, hematuria or hematochezia, swelling, back pain, chest pain, v/d.  ? ?She does not check blood sugar at home.  Reports the last time she tried to monitor, it was either really low or high, which she believed to be inaccurate.  Although she reports taking metformin, she only takes it  when she feels her sugars are high (sweet taste in mouth, sweet smell to urine, and polydipsia).  Never had trouble with hypertension.  She endorses no recent weight loss nor gain but has been attempting lifestyle modifications with diet, and feels clothes fit better.  ? ?Meds:  ?Metformin 500 mg BID, taking inconsistently, only when she feels hyperglycemic ?Advil for HA ?Xyzal ?Afrin for nose ?No outpatient medications have been marked as taking for the 01/30/22 encounter [facility] Encounter).  ? ? ? ?Allergies: ?Allergies as of 2022-01-30 - Review Complete 01/30/22  ?Allergen Reaction Noted  ? Codeine Other (See Comments) 07/06/2021  ? ?Past Medical History:  ?Diagnosis Date  ? Diabetes mellitus without complication (HCC)   ?Denies previous hospitalizations ? ?Family History: 2 sisters, 1 with DM and HTN, sister with hear condition, Mother died of breast CA diagnosed in 76s lived to 7. Denies history of stroke or neurological disorders. Never knew father. Daughter with bipolar disorder.  ? ?Social History: Denies current regular alcohol, illicit drug, and tobacco use.  She had 1 glass of wine after pedicure yesterday. Former smoker, started at age 71, smoked about 3 cigarettes a day. Stopped briefly with pregnancies, then began again in Jan 31, 2012 after death of husband. Stopped 5-6 years ago.  No supplements.  Lives alone, 3 adult children live in [place].  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  Currently  uninsured, no PCP since 2021, receives medications through mail order pharmacy in [place] when unable to get refill on her metformin.  Independent with ADLs and IADLs. ? ?Review of Systems: ?A complete ROS was negative except as per HPI.  ? ?Physical Exam: ?Blood pressure 127/76, pulse 76, temperature 97.6 ?F (36.4 ?C), temperature source Oral, resp. rate 17, height 5\' 2"  (1.575 m), weight 73.5 kg, SpO2 97 %. ?Physical Exam: ?General: Well-appearing obese woman of stated  age in NAD ?HENT: normocephalic, atraumatic, external nares and ears appear normal ?EYES: conjunctiva non-erythematous, no scleral icterus ?CV: regular rate, normal rhythm, no murmurs, rubs, gallops. ?Pulmonary: normal work of breathing on RA, lungs clear to auscultation bilaterally ?Abdominal: non-distended, soft, non-tender to palpation, normal BS all 4 quadrants ?Skin: Warm and dry ?Mental Status: ?Patient is awake, alert, oriented to person, place, month, year, and situation. ?Patient is able to give a clear and coherent history. ?No signs of aphasia or neglect ?Cranial Nerves: ?II: Visual Fields are full. ?III,IV, VI: EOMI without ptosis or diploplia.  ?V: Facial sensation is symmetric to light touch ?VII: Facial movement is symmetric resting and smiling ?VIII: Hearing is intact to voice ?X: Palate elevates symmetrically ?XI: Shoulder shrug is symmetric. ?XII: Tongue protrudes midline without atrophy or fasciculations.  ?Motor: ?Tone is normal. Bulk is normal. 5/5 strength was present in all four extremities.  ?Sensory: ?Sensation is symmetric to light touch in the arms and legs. No extinction to DSS present. ?Gait: Deferred  ?Psych: normal affect and behavior  ? ?EKG: personally reviewed my interpretation is normal sinus rhythm ? ?Assessment & Plan by Problem: ?Savannah Conner is a 56 year old female with past medical history of uncontrolled type 2 diabetes c/b diabetic retinopathy who presented from ophthalmologist's office after acute onset of transient BLE weakness leading to difficulty ambulating and expressive aphasia found to be hypertensive to 210/65 during this episode and to have an acute left pontine stroke on MRI. ? ?Principal Problem: ?  Left pontine stroke (HCC) ? ?#Acute left Pontine stroke ?Patient with 3 to 5 minutes of expressive aphasia and BLE weakness/heaviness during appointment for diabetic retinopathy.  Patient reports BP at that time was approximately 210/65, CBG 156. While in the ED,  second episode of dysarthria lasting 3 to 5 minutes, completely resolving. CTH negative, but MRI with acute left pontine infarct. Patient completely asymptomatic on assessment. Etiology likely small vessel disease 2/2 uncontrolled hypertension and diabetes versus 2/2 cardiac abnormality or arrhythmia.  Neurology on board, appreciate recs. ?-Aspirin 325 mg x 1, followed by DAPT with aspirin 81 mg and Plavix 75 mg daily ?-Follow-up labs to assess for risk factors: A1c, lipid panel, UDS ?- Frequent neurochecks ?- Follow-up echocardiogram ?- Telemetry monitoring ?- Permissive hypertension, goal SBP less than 220.  ?- PT/OT/SLP evaluation ?- Stroke team to follow ? ?#T2DM, uncontrolled ?#Diabetic retinopathy ?Patient without PCP since 2021, no record of A1c in chart.  Patient reports CBG 156 at ophthalmologist office; glucose on admission CMP 486, but no signs of DKA.  Patient obtaining metformin 500 mg twice daily through mail order pharmacy in [place], with which she is noncompliant.  Patient only takes metformin when she has symptoms of hyperglycemia.  Patient follows with ophthalmology for injections to treat retinopathy; scheduled to have laser treatment today. ?-Follow-up A1c ?- CBG monitoring ?- Semglee 5 units nightly ?- Sensitive SSI ?- Follow-up lipid panel to assess for concurrent dyslipidemia ?-Follow-up with ophthalmology outpatient for continued treatment of diabetic retinopathy ?- TOC consult to assist with  PCP needs ? ?#Elevated blood pressure ?Patient without formal diagnosis of hypertension.  BP 210/65 this a.m., since decreased to 145/69.  Patient currently on no antihypertensives.  She reports no symptoms; headache x1 week she believes to be due to sinus pressure. ?- Per neurology, permissive hypertension allowed at this time. ?- Advised to follow-up outpatient with PCP for monitoring/diagnosis and management. ? ?Best Practices ?Diet: Carb modified ?DVT prophylaxis: SQ Lovenox ?Code:Full ?Dispo: Admit  patient to Observation with expected length of stay less than 2 midnights. ? ?Signed: ?Lamar Sprinkles, MD ?01/29/2022, 4:53 PM  ?Pager: [phone] ?After 5pm on weekdays and 1pm on weekends: On Call pager: [phone]

## 2022-01-29 NOTE — ED Triage Notes (Signed)
Pt BIB [facility] from eye doctor after experiencing expressive aphasia. NAD,. No symptoms currently. ?

## 2022-01-29 NOTE — Code Documentation (Signed)
Stroke Response Nurse Documentation ?Code Documentation ? ?Savannah Conner is a 56 y.o. female arriving to [facility]  via [facility] on 01/29/2022 with past medical hx of diabetes.  ? ?Patient from [place] office where she was LKW at 1140. Noted to have expressive aphasia while at the doctor's office. Symptoms resolved by the time seen in ED and returned shortly after. Code stroke activated in ED. ? ?Stroke team at the bedside on patient arrival. Labs drawn and patient cleared for CT by Dr. Vanita Panda prior to activation. Patient to CT with team. NIHSS 0, see documentation for details and code stroke times. Symptoms resolved at time stroke team assessed patient. The following imaging was completed:  CT Head, CTA, and MRI. Patient is not a candidate for IV Thrombolytic due to too mild to treat, symptoms resolved. Patient is not not a candidate for IR due to no LVO.  ? ?Bedside handoff with ED RN.   ? ?Leverne Humbles ?Stroke Response RN ? ? ?

## 2022-01-29 NOTE — Progress Notes (Signed)
?  Echocardiogram ?2D Echocardiogram has been performed. ? ?Savannah Conner ?01/29/2022, 4:12 PM ?

## 2022-01-29 NOTE — ED Provider Notes (Signed)
?[facility] ?Provider Note ? ? ?CSN: [id number] ?Arrival date & time: 01/29/22  1108 ? ?  ? ?History ? ?Chief Complaint  ?Patient presents with  ? Aphasia  ? ? ?Savannah Conner is a 56 y.o. female. ? ?HPI ?Patient presents via EMS from ophthalmology office after episode of expressive aphasia, weakness. ?She is back to baseline, denies any current complaints, but soon after the initial evaluation the patient had a recurrence of dysarthria, and was designated as a code stroke. ?Patient notes a history of diabetes, denies history of hypertension, states that today she was at her physician's office, when she had about 10 minutes of inability to speak, with weakness in upper extremities, unclear which side was.  This resolved prior to EMS arrival and they note that in transport she was hypertensive, but otherwise awake, alert, unremarkable. ? ?  ? ?Home Medications ?Prior to Admission medications   ?Medication Sig Start Date End Date Taking? Authorizing Provider  ?dicyclomine (BENTYL) 20 MG tablet Take 1 tablet (20 mg total) by mouth 2 (two) times daily. 07/06/21   Gailen ShelterFondaw, Wylder S, PA  ?metoCLOPramide (REGLAN) 10 MG tablet Take 1 tablet (10 mg total) by mouth every 6 (six) hours. 07/06/21   Gailen ShelterFondaw, Wylder S, PA  ?   ? ?Allergies    ?Codeine   ? ?Review of Systems   ?Review of Systems  ?Constitutional:   ?     Per HPI, otherwise negative  ?HENT:    ?     Per HPI, otherwise negative  ?Respiratory:    ?     Per HPI, otherwise negative  ?Cardiovascular:   ?     Per HPI, otherwise negative  ?Gastrointestinal:  Negative for vomiting.  ?Endocrine:  ?     Negative aside from HPI  ?Genitourinary:   ?     Neg aside from HPI   ?Musculoskeletal:   ?     Per HPI, otherwise negative  ?Skin: Negative.   ?Neurological:  Positive for speech difficulty and weakness. Negative for syncope.  ? ?Physical Exam ?Updated Vital Signs ?BP (!) 145/62   Pulse 79   Temp 97.6 ?F (36.4 ?C) (Oral)   Resp 18   Ht  5\' 2"  (1.575 m)   Wt 73.5 kg   SpO2 97%   BMI 29.63 kg/m?  ?Physical Exam ?Vitals and nursing note reviewed.  ?Constitutional:   ?   General: She is not in acute distress. ?   Appearance: She is well-developed.  ?HENT:  ?   Head: Normocephalic and atraumatic.  ?Eyes:  ?   Conjunctiva/sclera: Conjunctivae normal.  ?Cardiovascular:  ?   Rate and Rhythm: Normal rate and regular rhythm.  ?Pulmonary:  ?   Effort: Pulmonary effort is normal. No respiratory distress.  ?   Breath sounds: Normal breath sounds. No stridor.  ?Abdominal:  ?   General: There is no distension.  ?Skin: ?   General: Skin is warm and dry.  ?Neurological:  ?   Mental Status: She is alert and oriented to person, place, and time.  ?   Cranial Nerves: No cranial nerve deficit or dysarthria.  ?   Motor: No weakness, tremor, atrophy or abnormal muscle tone.  ?Psychiatric:     ?   Mood and Affect: Mood normal.  ? ? ?ED Results / Procedures / Treatments   ?Labs ?(all labs ordered are listed, but only abnormal results are displayed) ?Labs Reviewed  ?APTT - Abnormal; Notable for the  following components:  ?    Result Value  ? aPTT 23 (*)   ? All other components within normal limits  ?CBC - Abnormal; Notable for the following components:  ? RDW 11.3 (*)   ? All other components within normal limits  ?COMPREHENSIVE METABOLIC PANEL - Abnormal; Notable for the following components:  ? CO2 21 (*)   ? Glucose, Bld 486 (*)   ? Calcium 8.8 (*)   ? AST 13 (*)   ? All other components within normal limits  ?I-STAT CHEM 8, ED - Abnormal; Notable for the following components:  ? BUN 21 (*)   ? Glucose, Bld 486 (*)   ? Calcium, Ion 1.10 (*)   ? All other components within normal limits  ?CBG MONITORING, ED - Abnormal; Notable for the following components:  ? Glucose-Capillary 402 (*)   ? All other components within normal limits  ?RESP PANEL BY RT-PCR (FLU A&B, COVID) ARPGX2  ?PROTIME-INR  ?DIFFERENTIAL  ?RAPID URINE DRUG SCREEN, HOSP PERFORMED  ?URINALYSIS, ROUTINE W  REFLEX MICROSCOPIC  ?I-STAT BETA HCG BLOOD, ED ([facility], [facility], AP ONLY)  ? ? ?EKG ?None ? ?Radiology ?CT ANGIO HEAD NECK W WO CM ? ?Result Date: 01/29/2022 ?CLINICAL DATA:  TIA EXAM: CT ANGIOGRAPHY HEAD AND NECK TECHNIQUE: Multidetector CT imaging of the head and neck was performed using the standard protocol during bolus administration of intravenous contrast. Multiplanar CT image reconstructions and MIPs were obtained to evaluate the vascular anatomy. Carotid stenosis measurements (when applicable) are obtained utilizing NASCET criteria, using the distal internal carotid diameter as the denominator. RADIATION DOSE REDUCTION: This exam was performed according to the departmental dose-optimization program which includes automated exposure control, adjustment of the mA and/or kV according to patient size and/or use of iterative reconstruction technique. CONTRAST:  58mL OMNIPAQUE IOHEXOL 350 MG/ML SOLN COMPARISON:  Same-day noncontrast head CT FINDINGS: CTA NECK FINDINGS Aortic arch: There is mild calcified atherosclerotic plaque of the aortic arch. The origins of the major branch vessels are patent. The subclavian arteries are patent to the level imaged. Right carotid system: The right common, internal, and external carotid arteries are patent, without hemodynamically significant stenosis or occlusion. There is no dissection or aneurysm. Left carotid system: The left common carotid artery is patent. There is mild mixed plaque at the bifurcation resulting in less than 50% stenosis. The distal left internal carotid artery is patent. The left external carotid artery is patent. There is no dissection or aneurysm. Vertebral arteries: The vertebral arteries are patent without hemodynamically significant stenosis or occlusion. There is no dissection or aneurysm. Skeleton: There is no acute osseous abnormality or aggressive osseous lesion. There is no visible canal hematoma. Other neck: The soft tissues are unremarkable. Upper  chest: The imaged lung apices are clear. Review of the MIP images confirms the above findings CTA HEAD FINDINGS Anterior circulation: There is calcified plaque in the bilateral intracranial ICAs resulting in mild stenosis of the right cavernous segment and moderate stenosis of the left supraclinoid segment. The bilateral MCAs are patent The bilateral ACAs are patent. The anterior communicating artery is normal There is no aneurysm or AVM. Posterior circulation: The bilateral V4 segments are patent. There is focal calcified plaque on the right without hemodynamically significant stenosis. PICA is identified bilaterally. The basilar artery is patent. The bilateral PCAs are patent. There is no aneurysm or AVM. Venous sinuses: Patent. Anatomic variants: None. Review of the MIP images confirms the above findings IMPRESSION: 1. No emergent large vessel  occlusion. 2. Patent vasculature of the neck with mild mixed plaque at the left carotid bifurcation resulting in less than 50% stenosis. No significant stenosis on the right. Patent vertebral arteries. 3. Calcified plaque in the bilateral intracranial ICAs resulting in mild stenosis on the right and moderate stenosis on the left. Otherwise, patent vasculature of the head. These findings were communicated to Dr. Wilford Corner via text at 12:45 p.m. Electronically Signed   By: Lesia Hausen M.D.   On: 01/29/2022 12:46  ? ?CT HEAD CODE STROKE WO CONTRAST` ? ?Result Date: 01/29/2022 ?CLINICAL DATA:  Code stroke. EXAM: CT HEAD WITHOUT CONTRAST TECHNIQUE: Contiguous axial images were obtained from the base of the skull through the vertex without intravenous contrast. RADIATION DOSE REDUCTION: This exam was performed according to the departmental dose-optimization program which includes automated exposure control, adjustment of the mA and/or kV according to patient size and/or use of iterative reconstruction technique. COMPARISON:  None. FINDINGS: Brain: No evidence of acute large vascular  territory infarction, hemorrhage, hydrocephalus, extra-axial collection or mass lesion/mass effect. Vascular: No hyperdense vessel identified. Calcific intracranial atherosclerosis. Skull: No acute fracture. Sin

## 2022-01-30 LAB — CBC
HCT: 36.2 % (ref 36.0–46.0)
Hemoglobin: 12.4 g/dL (ref 12.0–15.0)
MCH: 30.7 pg (ref 26.0–34.0)
MCHC: 34.3 g/dL (ref 30.0–36.0)
MCV: 89.6 fL (ref 80.0–100.0)
Platelets: 256 10*3/uL (ref 150–400)
RBC: 4.04 MIL/uL (ref 3.87–5.11)
RDW: 11.5 % (ref 11.5–15.5)
WBC: 7.2 10*3/uL (ref 4.0–10.5)
nRBC: 0 % (ref 0.0–0.2)

## 2022-01-30 LAB — BASIC METABOLIC PANEL
Anion gap: 7 (ref 5–15)
BUN: 14 mg/dL (ref 6–20)
CO2: 20 mmol/L — ABNORMAL LOW (ref 22–32)
Calcium: 8.2 mg/dL — ABNORMAL LOW (ref 8.9–10.3)
Chloride: 110 mmol/L (ref 98–111)
Creatinine, Ser: 0.43 mg/dL — ABNORMAL LOW (ref 0.44–1.00)
GFR, Estimated: >60 mL/min (ref >60)
Glucose, Bld: 287 mg/dL — ABNORMAL HIGH (ref 70–99)
Potassium: 3.7 mmol/L (ref 3.5–5.1)
Sodium: 137 mmol/L (ref 135–145)

## 2022-01-30 LAB — GLUCOSE, CAPILLARY
Glucose-Capillary: 266 mg/dL — ABNORMAL HIGH (ref 70–99)
Glucose-Capillary: 356 mg/dL — ABNORMAL HIGH (ref 70–99)
Glucose-Capillary: 166 mg/dL — ABNORMAL HIGH (ref 70–99)
Glucose-Capillary: 214 mg/dL — ABNORMAL HIGH (ref 70–99)

## 2022-01-30 LAB — LDL CHOLESTEROL, DIRECT: Direct LDL: 169 mg/dL — ABNORMAL HIGH (ref 0–99)

## 2022-01-30 LAB — LIPID PANEL
Cholesterol: 290 mg/dL — ABNORMAL HIGH (ref 0–200)
HDL: 27 mg/dL — ABNORMAL LOW (ref >40)
LDL Cholesterol: UNDETERMINED mg/dL (ref 0–99)
Total CHOL/HDL Ratio: 10.7 RATIO
Triglycerides: 505 mg/dL — ABNORMAL HIGH (ref <150)
VLDL: UNDETERMINED mg/dL (ref 0–40)

## 2022-01-30 LAB — HEMOGLOBIN A1C
Hgb A1c MFr Bld: 12.4 % — ABNORMAL HIGH (ref 4.8–5.6)
Mean Plasma Glucose: 309.18 mg/dL

## 2022-01-30 LAB — HIV ANTIBODY (ROUTINE TESTING W REFLEX): HIV Screen 4th Generation wRfx: NONREACTIVE

## 2022-01-30 MED ORDER — INSULIN GLARGINE-YFGN 100 UNIT/ML ~~LOC~~ SOLN
10.0000 [IU] | Freq: Every day | SUBCUTANEOUS | Status: DC
  Filled 2022-01-30: qty 0.1

## 2022-01-30 MED ORDER — ATORVASTATIN CALCIUM 80 MG PO TABS
80.0000 mg | ORAL_TABLET | Freq: Every day | ORAL | Status: DC
  Administered 2022-01-30 – 2022-01-31 (×2): 80 mg via ORAL
  Filled 2022-01-30 (×2): qty 1

## 2022-01-30 MED ORDER — INSULIN GLARGINE-YFGN 100 UNIT/ML ~~LOC~~ SOLN
15.0000 [IU] | Freq: Every day | SUBCUTANEOUS | Status: DC
  Administered 2022-01-30: 15 [IU] via SUBCUTANEOUS
  Filled 2022-01-30 (×2): qty 0.15

## 2022-01-30 MED ORDER — INSULIN ASPART 100 UNIT/ML IJ SOLN
5.0000 [IU] | Freq: Once | INTRAMUSCULAR | Status: AC
  Administered 2022-01-30: 5 [IU] via SUBCUTANEOUS

## 2022-01-30 NOTE — Progress Notes (Signed)
? ?Subjective:  ?Overnight Events: No acute events or concerns overnight. ? ?Patient was seen and examined on rounds.  Patient reports feeling well this morning.  She just spoke with the neurology team who updated her on their plan for her.  She reports her symptoms have resolved aside from some difficulties with fine motor movements.  She reiterates difficulties obtaining medications and has been getting her metformin from [place] and using it only as needed.  She is amendable to following up in clinic with [place] for follow-up.  She may be leaving [place] in May when her contract is finished for work, however they may also extend the contract.  We encouraged her to see [place] in clinic for her diabetes management in the interim.  Discussed with patient that we are still working on getting her blood sugars under control this morning. ? ?Objective: ? ?Vital signs in last 24 hours: ?Vitals:  ? 01/29/22 2327 01/30/22 0357 01/30/22 0807 01/30/22 1224  ?BP: 129/67 138/70 120/63 121/63  ?Pulse: 77 66 77 77  ?Resp:   16 20  ?Temp: 98.3 ?F (36.8 ?C) 98 ?F (36.7 ?C) 98.2 ?F (36.8 ?C) 98.7 ?F (37.1 ?C)  ?TempSrc: Oral Oral Oral Oral  ?SpO2: 97% 99% 96% 99%  ?Weight:      ?Height:      ? ? ? ?Intake/Output Summary (Last 24 hours) at 01/30/2022 1309 ?Last data filed at 01/30/2022 1224 ?Gross per 24 hour  ?Intake 1639.52 ml  ?Output --  ?Net 1639.52 ml  ? ? ?Physical Exam: ?General: Well appearing normal weight female, NAD ?HENT: normocephalic, atraumatic, external ears and nares appear normal ?EYES: conjunctiva non-erythematous, no scleral icterus ?CV: regular rate, normal rhythm, no murmurs, rubs, gallops. ?Pulmonary: normal work of breathing on RA, lungs clear to auscultation, no rales, wheezes, rhonchi ?Abdominal: non-distended, soft, non-tender to palpation, normal BS ?Skin: Warm and dry, no rashes or lesions ?Neurological: ?MS: awake, alert and oriented x3, normal speech and fund of knowledge, EOMI, face appears symmetric,  tongue midline. ?Motor: moves all extremities antigravity, very very mild decrease speed with RAM ?Psych: normal affect ? ? ?Assessment/Plan: ? ?Principal Problem: ?  Left pontine stroke (HCC) ? ?Margeret Conner is a 56 year old female with past medical history of uncontrolled type 2 diabetes c/b diabetic retinopathy who presented from ophthalmologist's office after acute onset of transient BLE weakness leading to difficulty ambulating and expressive aphasia found to have an acute left pontine stroke on MRI. ? ?#Acute left pontine stroke ?#Remote R pontine stroke ?#HLD ?Etiology likely small vessel disease in the setting of hypertension/diabetes. Patient's aphasia and LE weakness have resolved.  Per stroke risk stratification work-up, lipid panel and hemoglobin A1c were ordered showing LDL 169 hemoglobin A1c of 18.8%. She is now on DAPT and high intensity statin, with sliding scale insulin and nightly long-acting insulin for diabetes management while inpatient.  Echo obtained and showed EF 60 to 65% and grade 1 diastolic dysfunction without signs of shunt or thrombus. Patient's blood pressure now normotensive.  ?Plan: ?-Neurology following, appreciate recommendations ?-Continue aspirin 81 mg daily and Plavix 75 mg daily for 3 weeks (End date: 4/8) followed by Plavix 75 mg alone ?-Continue Lipitor 80 mg daily ?-Patient will follow up with [facility] as PCP ?-SLP, PT recommending no further follow-up ?-OT eval pending ?-Follow-up with [facility] ? ?#Poorly controlled T2DM with hyperglycemia ?#Diabetic retinopathy ?Patient without PCP since 2021, no record of A1c in chart.  Hemoglobin A1c 12.4% this admission.  Patient will work on obtaining  health insurance to afford medications.  Reports current only home med metformin both regular and extended release both cause diarrhea.  Currently blood sugars while inpatient still poorly controlled with CBGs 200s-300s, no signs of DKA.  Gave additional single dose short acting 5 units this  afternoon. Does not have a PCP. ?Plan: ?-Increased Semglee to 15 units nightly ?-Sensitive SSI ?-CBGs with meals and nightly ?-Follow-up with ophthalmology outpatient for continued treatment of diabetic retinopathy ?-Patient will follow-up with [facility] as PCP ? ?#Elevated blood pressure, resolved ?Patient has no formal diagnosis of hypertension.  On admission BPs systolic 200+ though has now become normotensive without interventions.  Allowing for permissive hypertension per neurology. ? ?Diet: CM ?VTE: Lovenox ?IVF: None ?Code: Full ? ?Prior to Admission Living Arrangement: Home ?Anticipated Discharge Location: Home ?Barriers to Discharge: Pending OT eval, improvement of blood sugars, optimizing regimen for discharge. ?Dispo: Anticipated discharge in approximately 1 day(s).  ? ?Portions of this report may have been transcribed using voice recognition software. Every effort was made to ensure accuracy; however, inadvertent computerized transcription errors may be present.  ? ?Savannah Carwin, MD ?01/30/22,  1:09 PM ?Pager: [phone] ?Internal Medicine Resident, PGY-1 ?[facility]  ?  ?Please contact the on call pager after 5 pm and on weekends at [phone]. ? ?  ? ? ?

## 2022-01-30 NOTE — Evaluation (Signed)
Occupational Therapy Evaluation ?Patient Details ?Name: Savannah Conner ?MRN: [id number] ?DOB: [birth year] ?Today's Date: 01/30/2022 ? ? ?History of Present Illness pt is a 56 y/o female admitted from an optho appointment for diabetic retinopathy after developing expressive aphasia and weak legs.  MRI showed small acute infarcts in the left pons.  Also noted small remote infarct inthe right pons.  PMHx: DM  ? ?Clinical Impression ?  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 States she lives in [place] but is in [place] on a work assignment.  Pt complains of "mild" blurry vision persisting. Educated pt on compensatory strategies for low vision and discussed strategies to make her return to work easier. Pt states her workplace provides transportation to work. Pt is concerned about managing her diabetes better and plans to follow up with the [facility] clinic to help with disease management. No further OT needs. OT signing off.  ?   ? ?Recommendations for follow up therapy are one component of a multi-disciplinary discharge planning process, led by the attending physician.  Recommendations may be updated based on patient status, additional functional criteria and insurance authorization.  ? ?Follow Up Recommendations ? No OT follow up  ?  ?Assistance Recommended at Discharge    ?Patient can return home with the following Assist for transportation ? ?  ?Functional Status Assessment ? Patient has not had a recent decline in their functional status  ?Equipment Recommendations ? None recommended by OT  ?  ?Recommendations for Other Services   ? ? ?  ?Precautions / Restrictions Precautions ?Precautions: Fall ?Restrictions ?Weight Bearing Restrictions: No  ? ?  ? ?Mobility Bed Mobility ?Overal bed mobility: Modified Independent ?  ?  ?  ?  ?  ?  ?  ?  ? ?Transfers ?Overall transfer level: Modified independent ?  ?  ?  ?  ?  ?  ?  ?  ?  ?  ? ?  ?Balance Overall balance assessment: Modified  Independent ?  ?  ?  ?  ?  ?  ?  ?  ?  ?  ?  ?  ?  ?  ?  ?  ?  ?  ?   ? ?ADL either performed or assessed with clinical judgement  ? ?ADL Overall ADL's : At baseline ?  ?  ?  ?  ?  ?  ?  ?  ?  ?  ?  ?  ?  ?  ?  ?  ?  ?  ?  ?   ? ? ? ?Vision Baseline Vision/History: 1 Wears glasses ?Vision Assessment?: Yes ?Eye Alignment: Within Functional Limits ?Ocular Range of Motion: Within Functional Limits ?Alignment/Gaze Preference: Within Defined Limits ?Tracking/Visual Pursuits: Able to track stimulus in all quads without difficulty ?Saccades: Within functional limits ?Convergence: Within functional limits ?Visual Fields: No apparent deficits ?Additional Comments: pt states vision is "better". Continues to complain of "blurry vision".  ?   ?Perception Perception ?Comments: wfl ?  ?Praxis   ?  ? ?Pertinent Vitals/Pain Pain Assessment ?Pain Assessment: No/denies pain  ? ? ? ?Hand Dominance Right ?  ?Extremity/Trunk Assessment Upper Extremity Assessment ?Upper Extremity Assessment: Overall WFL for tasks assessed ?  ?Lower Extremity Assessment ?Lower Extremity Assessment: Defer to PT evaluation ?  ?Cervical / Trunk Assessment ?Cervical / Trunk Assessment: Normal ?  ?Communication Communication ?Communication: No difficulties (word finding deficits at times per pt) ?  ?Cognition Arousal/Alertness: Awake/alert ?Behavior During Therapy: [facility] for tasks  assessed/performed ?Overall Cognitive Status: Within Functional Limits for tasks assessed ?  ?  ?  ?  ?  ?  ?  ?  ?  ?  ?  ?  ?  ?  ?  ?  ?  ?  ?  ?General Comments  Able to verbalize understanding of previous education from diabetes instructor ? ?  ?Exercises   ?  ?Shoulder Instructions    ? ? ?Home Living Family/patient expects to be discharged to:: Private residence ?Living Arrangements: Alone ?Available Help at Discharge: Friend(s);Available PRN/intermittently ?Type of Home: Apartment ?Home Access: Elevator ?  ?  ?Home Layout: One level ?  ?  ?Bathroom Shower/Tub: Walk-in shower ?   ?Bathroom Toilet: Standard ?Bathroom Accessibility: Yes ?How Accessible: Accessible via walker ?Home Equipment: None ?  ?  ?  ? ?  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  ?  ?  ?  ?  ?  ?  ?  ?  ? ?  ?  ?OT Problem List: Impaired vision/perception ?  ?   ?OT Treatment/Interventions:    ?  ?OT Goals(Current goals can be found in the care plan section) Acute Rehab OT Goals ?Patient Stated Goal: TO TAKE BETTER CONTROL OF HER DIABETES ?OT Goal Formulation: All assessment and education complete, DC therapy  ?OT Frequency:   ?  ? ?Co-evaluation   ?  ?  ?  ?  ? ?  ?AM-PAC OT "6 Clicks" Daily Activity     ?Outcome Measure Help from another person eating meals?: None ?Help from another person taking care of personal grooming?: None ?Help from another person toileting, which includes using toliet, bedpan, or urinal?: None ?Help from another person bathing (including washing, rinsing, drying)?: None ?Help from another person to put on and taking off regular upper body clothing?: None ?Help from another person to put on and taking off regular lower body clothing?: None ?6 Click Score: 24 ?  ?End of Session Nurse Communication: Other (comment) (DC needs) ? ?Activity Tolerance: Patient tolerated treatment well ?Patient left: in bed;with call bell/phone within reach ? ?OT Visit Diagnosis: Low vision, both eyes (H54.2)  ?              ?Time: 7124-5809 ?OT Time Calculation (min): 15 min ?Charges:  OT General Charges ?$OT Visit: 1 Visit ?OT Evaluation ?$OT Eval Low Complexity: 1 Low ? ?[facility], OT/L  ? ?Acute OT Clinical Specialist ?Acute Rehabilitation Services ?Pager [phone] ?Office [phone]  ? ?Akiyah Eppolito,HILLARY ?01/30/2022, 4:35 PM ?

## 2022-01-30 NOTE — Evaluation (Signed)
Physical Therapy Evaluation ?Patient Details ?Name: Savannah Conner ?MRN: [id number] ?DOB: 12/28/1965 ?Today's Date: 01/30/2022 ? ?History of Present Illness ? pt is a 56 y/o female admitted from an optho appointment for diabetic retinopathy after developing expressive aphasia and weak legs.  MRI showed small acute infarcts in the left pons.  Also noted small remote infarct inthe right pons.  PMHx: DM  ?Clinical Impression ? Pt is at or close to baseline functioning and should be safe at home with PRN assist from colleagues. There are no further acute PT needs.  Will sign off at this time. ?   ?   ? ?Recommendations for follow up therapy are one component of a multi-disciplinary discharge planning process, led by the attending physician.  Recommendations may be updated based on patient status, additional functional criteria and insurance authorization. ? ?Follow Up Recommendations No PT follow up ? ?  ?Assistance Recommended at Discharge PRN  ?Patient can return home with the following ? Assist for transportation ? ?  ?Equipment Recommendations None recommended by PT  ?Recommendations for Other Services ?    ?  ?Functional Status Assessment Patient has had a recent decline in their functional status and demonstrates the ability to make significant improvements in function in a reasonable and predictable amount of time.  ? ?  ?Precautions / Restrictions Precautions ?Precautions: Fall ?Restrictions ?Weight Bearing Restrictions: No  ? ?  ? ?Mobility ? Bed Mobility ?Overal bed mobility: Modified Independent ?  ?  ?  ?  ?  ?  ?  ?  ? ?Transfers ?Overall transfer level: Modified independent ?  ?  ?  ?  ?  ?  ?  ?  ?  ?  ? ?Ambulation/Gait ?Ambulation/Gait assistance: Supervision ?Gait Distance (Feet): 250 Feet ?Assistive device: None ?Gait Pattern/deviations: Step-through pattern ?  ?Gait velocity interpretation: >4.37 ft/sec, indicative of normal walking speed ?  ?General Gait Details: generally steady, gait speed age  appropriate, 1-2 deviations seen with scanning, abrupt turns, not LOB. ? ?Stairs ?Stairs: Yes ?Stairs assistance: Modified independent (Device/Increase time) ?Stair Management: One rail Right, Alternating pattern, Forwards ?Number of Stairs: 5 ?General stair comments: safe with the rail ? ?Wheelchair Mobility ?  ? ?Modified Rankin (Stroke Patients Only) ?Modified Rankin (Stroke Patients Only) ?Pre-Morbid Rankin Score: No symptoms ?Modified Rankin: No symptoms ? ?  ? ?Balance Overall balance assessment: Modified Independent ?  ?  ?  ?  ?  ?  ?  ?  ?  ?  ?  ?  ?  ?  ?  ?Standardized Balance Assessment ?Standardized Balance Assessment : Berg Balance Test ?Merrilee Jansky Balance Test ?Sit to Stand: Able to stand without using hands and stabilize independently ?Standing Unsupported: Able to stand safely 2 minutes ?Sitting with Back Unsupported but Feet Supported on Floor or Stool: Able to sit safely and securely 2 minutes ?Stand to Sit: Sits safely with minimal use of hands ?Transfers: Able to transfer safely, definite need of hands ?Standing Unsupported with Eyes Closed: Able to stand 10 seconds with supervision ?Standing Ubsupported with Feet Together: Able to place feet together independently and stand for 1 minute with supervision ?From Standing, Reach Forward with Outstretched Arm: Can reach forward >12 cm safely (5") ?From Standing Position, Pick up Object from Floor: Able to pick up shoe, needs supervision ?From Standing Position, Turn to Look Behind Over each Shoulder: Looks behind from both sides and weight shifts well ?Turn 360 Degrees: Able to turn 360 degrees safely in 4 seconds or less ?  Standing Unsupported, Alternately Place Feet on Step/Stool: Able to stand independently and safely and complete 8 steps in 20 seconds ?Standing Unsupported, One Foot in Front: Able to take small step independently and hold 30 seconds ?Standing on One Leg: Able to lift leg independently and hold equal to or more than 3 seconds ?Total  Score: 47 ?  ?   ? ? ? ?Pertinent Vitals/Pain Pain Assessment ?Pain Assessment: No/denies pain  ? ? ?Home Living Family/patient expects to be discharged to:: Private residence ?Living Arrangements: Alone ?Available Help at Discharge: Friend(s);Available PRN/intermittently ?Type of Home: Apartment ?Home Access: Elevator ?  ?  ?  ?Home Layout: One level ?Home Equipment: None ?   ?  ?Prior Function Prior Level of Function : Independent/Modified Independent ?  ?  ?  ?  ?  ?  ?  ?  ?  ? ? ?Hand Dominance  ?   ? ?  ?Extremity/Trunk Assessment  ? Upper Extremity Assessment ?Upper Extremity Assessment: Overall WFL for tasks assessed ?  ? ?Lower Extremity Assessment ?Lower Extremity Assessment: Overall WFL for tasks assessed ?  ? ?Cervical / Trunk Assessment ?Cervical / Trunk Assessment: Normal  ?Communication  ? Communication: No difficulties  ?Cognition Arousal/Alertness: Awake/alert ?Behavior During Therapy: [facility] for tasks assessed/performed ?Overall Cognitive Status: Within Functional Limits for tasks assessed ?  ?  ?  ?  ?  ?  ?  ?  ?  ?  ?  ?  ?  ?  ?  ?  ?  ?  ?  ? ?  ?General Comments   ? ?  ?Exercises    ? ?Assessment/Plan  ?  ?PT Assessment Patient needs continued PT services;Patient does not need any further PT services  ?PT Problem List Decreased balance;Decreased activity tolerance ? ?   ?  ?PT Treatment Interventions     ? ?PT Goals (Current goals can be found in the Care Plan section)  ?Acute Rehab PT Goals ?PT Goal Formulation: All assessment and education complete, DC therapy ? ?  ?Frequency   ?  ? ? ?Co-evaluation   ?  ?  ?  ?  ? ? ?  ?AM-PAC PT "6 Clicks" Mobility  ?Outcome Measure Help needed turning from your back to your side while in a flat bed without using bedrails?: None ?Help needed moving from lying on your back to sitting on the side of a flat bed without using bedrails?: None ?Help needed moving to and from a bed to a chair (including a wheelchair)?: None ?Help needed standing up from a chair  using your arms (e.g., wheelchair or bedside chair)?: None ?Help needed to walk in hospital room?: None ?Help needed climbing 3-5 steps with a railing? : None ?6 Click Score: 24 ? ?  ?End of Session   ?Activity Tolerance: Patient tolerated treatment well ?Patient left: in bed;with call bell/phone within reach ?Nurse Communication: Mobility status ?PT Visit Diagnosis: Other symptoms and signs involving the nervous system (R29.898) ?  ? ?Time: 1009-1030 ?PT Time Calculation (min) (ACUTE ONLY): 21 min ? ? ?Charges:   PT Evaluation ?$PT Eval Low Complexity: 1 Low ?  ?  ?   ? ? ?01/30/2022 ? ?Ginger Carne., PT ?Acute Rehabilitation Services ?[phone]  (pager) ?[phone]  (office) ? ?Tessie Fass Edla Para ?01/30/2022, 10:59 AM ? ?

## 2022-01-30 NOTE — TOC Initial Note (Signed)
Transition of Care (TOC) - Initial/Assessment Note  ? ? ?Patient Details  ?Name: Savannah Conner ?MRN: [id number] ?Date of Birth: April 15, 1966 ? ?Transition of Care (TOC) CM/SW Contact:    ?Pollie Friar, RN ?Phone Number: ?01/30/2022, 2:11 PM ? ?Clinical Narrative:                 ?Patient is from home alone. She states her neighbor can check on her and provide some transportation if needed.  ?She manages her own medications at home and denies any issues.  ?Patient is without a PCP. [facility] is going to see her in their clinic. They should place appointment on AVS at discharge.  ?[facility]: [facility] on [address].  ?Pt uses lyft for most of her transportation needs.  ?CM has updated her d/c pharmacy to [facility] so CM can assist with medications if needed.  ?TOC following. ? ?Expected Discharge Plan: Home/Self Care ?Barriers to Discharge: Continued Medical Work up ? ? ?Patient Goals and CMS Choice ?  ?  ?  ? ?Expected Discharge Plan and Services ?Expected Discharge Plan: Home/Self Care ?  ?Discharge Planning Services: CM Consult ?  ?Living arrangements for the past 2 months: Apartment ?                ?  ?  ?  ?  ?  ?  ?  ?  ?  ?  ? ?Prior Living Arrangements/Services ?Living arrangements for the past 2 months: Apartment ?Lives with:: Self ?Patient language and need for interpreter reviewed:: Yes ?Do you feel safe going back to the place where you live?: Yes      ?  ?Care giver support system in place?: No (comment) ?  ?Criminal Activity/Legal Involvement Pertinent to Current Situation/Hospitalization: No - Comment as needed ? ?Activities of Daily Living ?Home Assistive Devices/Equipment: None ?ADL Screening (condition at time of admission) ?Patient's cognitive ability adequate to safely complete daily activities?: Yes ?Is the patient deaf or have difficulty hearing?: No ?Does the patient have difficulty seeing, even when wearing glasses/contacts?: No ?Does the patient have difficulty concentrating, remembering,  or making decisions?: No ?Patient able to express need for assistance with ADLs?: Yes ?Does the patient have difficulty dressing or bathing?: No ?Independently performs ADLs?: Yes (appropriate for developmental age) ?Does the patient have difficulty walking or climbing stairs?: No ?Weakness of Legs: Both ?Weakness of Arms/Hands: None ? ?Permission Sought/Granted ?  ?  ?   ?   ?   ?   ? ?Emotional Assessment ?Appearance:: Appears stated age ?Attitude/Demeanor/Rapport: Engaged ?Affect (typically observed): Accepting ?Orientation: : Oriented to Self, Oriented to Place, Oriented to  Time, Oriented to Situation ?  ?Psych Involvement: No (comment) ? 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 Diagnosis Date Noted  ? Left pontine stroke ([place]) 01/29/2022  ? ?PCP:  Pcp, No ?Pharmacy:   ?[facility] ?[address] ?[place] [address] ?Phone: [phone] Fax: [phone] ? ? ? ? ?Social Determinants of Health (SDOH) Interventions ?  ? ?Readmission Risk Interventions ?   ? View : No data to display.  ?  ?  ?  ? ? ? ?

## 2022-01-30 NOTE — Progress Notes (Addendum)
STROKE TEAM PROGRESS NOTE  ? ?INTERVAL HISTORY ?PT is at the bedside to evaluate. Overall, her symptoms have largely resolved, but she does still have some slowing with fine motor and RAM. [place]. Requested assistance in finding a PCP in town since she will be her for a couple of months. Recommend follow up with [facility] as well.  ? ?Vitals:  ? 01/29/22 2005 01/29/22 2030 01/29/22 2327 01/30/22 0357  ?BP: 129/64 115/85 129/67 138/70  ?Pulse: 80 78 77 66  ?Resp: 20 18    ?Temp: 98.5 ?F (36.9 ?C) 98.6 ?F (37 ?C) 98.3 ?F (36.8 ?C) 98 ?F (36.7 ?C)  ?TempSrc: Oral Oral Oral Oral  ?SpO2: 98% 100% 97% 99%  ?Weight:      ?Height:      ? ?CBC:  ?Recent Labs  ?Lab 01/29/22 ?1140 01/29/22 ?1151 01/30/22 ?0215  ?WBC 7.9  --  7.2  ?NEUTROABS 5.0  --   --   ?HGB 13.1 13.6 12.4  ?HCT 39.4 40.0 36.2  ?MCV 92.3  --  89.6  ?PLT 255  --  256  ? ?Basic Metabolic Panel:  ?Recent Labs  ?Lab 01/29/22 ?1140 01/29/22 ?1151 01/30/22 ?0215  ?NA 135 136 137  ?K 4.0 4.0 3.7  ?CL 105 104 110  ?CO2 21*  --  20*  ?GLUCOSE 486* 486* 287*  ?BUN 19 21* 14  ?CREATININE 0.69 0.50 0.43*  ?CALCIUM 8.8*  --  8.2*  ? ?Lipid Panel:  ?Recent Labs  ?Lab 01/30/22 ?0215  ?CHOL 290*  ?TRIG 505*  ?HDL 27*  ?CHOLHDL 10.7  ?VLDL UNABLE TO CALCULATE IF TRIGLYCERIDE OVER 400 mg/dL  ?LDLCALC UNABLE TO CALCULATE IF TRIGLYCERIDE OVER 400 mg/dL  ? ?HgbA1c:  ?Recent Labs  ?Lab 01/30/22 ?0215  ?HGBA1C 12.4*  ? ?Urine Drug Screen:  ?Recent Labs  ?Lab 01/29/22 ?1430  ?LABOPIA NONE DETECTED  ?COCAINSCRNUR NONE DETECTED  ?LABBENZ NONE DETECTED  ?AMPHETMU NONE DETECTED  ?THCU NONE DETECTED  ?LABBARB NONE DETECTED  ?  ?Alcohol Level No results for input(s): ETH in the last 168 hours. ? ?IMAGING past 24 hours ?CT ANGIO HEAD NECK W WO CM ? ?Result Date: 01/29/2022 ?CLINICAL DATA:  TIA EXAM: CT ANGIOGRAPHY HEAD AND NECK TECHNIQUE: Multidetector CT imaging of the head and neck was performed using the standard protocol during bolus administration of  intravenous contrast. Multiplanar CT image reconstructions and MIPs were obtained to evaluate the vascular anatomy. Carotid stenosis measurements (when applicable) are obtained utilizing NASCET criteria, using the distal internal carotid diameter as the denominator. RADIATION DOSE REDUCTION: This exam was performed according to the departmental dose-optimization program which includes automated exposure control, adjustment of the mA and/or kV according to patient size and/or use of iterative reconstruction technique. CONTRAST:  75mL OMNIPAQUE IOHEXOL 350 MG/ML SOLN COMPARISON:  Same-day noncontrast head CT FINDINGS: CTA NECK FINDINGS Aortic arch: There is mild calcified atherosclerotic plaque of the aortic arch. The origins of the major branch vessels are patent. The subclavian arteries are patent to the level imaged. Right carotid system: The right common, internal, and external carotid arteries are patent, without hemodynamically significant stenosis or occlusion. There is no dissection or aneurysm. Left carotid system: The left common carotid artery is patent. There is mild mixed plaque at the bifurcation resulting in less than 50% stenosis. The distal left internal carotid artery is patent. The left external carotid artery is patent. There is no dissection or aneurysm. Vertebral arteries: The vertebral arteries are  patent without hemodynamically significant stenosis or occlusion. There is no dissection or aneurysm. Skeleton: There is no acute osseous abnormality or aggressive osseous lesion. There is no visible canal hematoma. Other neck: The soft tissues are unremarkable. Upper chest: The imaged lung apices are clear. Review of the MIP images confirms the above findings CTA HEAD FINDINGS Anterior circulation: There is calcified plaque in the bilateral intracranial ICAs resulting in mild stenosis of the right cavernous segment and moderate stenosis of the left supraclinoid segment. The bilateral MCAs are patent  The bilateral ACAs are patent. The anterior communicating artery is normal There is no aneurysm or AVM. Posterior circulation: The bilateral V4 segments are patent. There is focal calcified plaque on the right without hemodynamically significant stenosis. PICA is identified bilaterally. The basilar artery is patent. The bilateral PCAs are patent. There is no aneurysm or AVM. Venous sinuses: Patent. Anatomic variants: None. Review of the MIP images confirms the above findings IMPRESSION: 1. No emergent large vessel occlusion. 2. Patent vasculature of the neck with mild mixed plaque at the left carotid bifurcation resulting in less than 50% stenosis. No significant stenosis on the right. Patent vertebral arteries. 3. Calcified plaque in the bilateral intracranial ICAs resulting in mild stenosis on the right and moderate stenosis on the left. Otherwise, patent vasculature of the head. These findings were communicated to Dr. Wilford Corner via text at 12:45 p.m. Electronically Signed   By: Lesia Hausen M.D.   On: 01/29/2022 12:46  ? ?MR BRAIN WO CONTRAST ? ?Result Date: 01/29/2022 ?CLINICAL DATA:  Expressive aphasia EXAM: MRI HEAD WITHOUT CONTRAST TECHNIQUE: Multiplanar, multiecho pulse sequences of the brain and surrounding structures were obtained without intravenous contrast. COMPARISON:  Same-day CT head FINDINGS: Brain: There are small foci of diffusion restriction in the left pons with associated T2/FLAIR signal abnormality consistent with acute infarct. There is no associated hemorrhage. There is no other evidence of acute infarct. There is a small remote infarct in the right pons. There is no acute intracranial hemorrhage, or extra-axial fluid collection. Background parenchymal volume is normal. The ventricles are normal in size. Scattered small foci of FLAIR signal abnormality in the subcortical and periventricular white matter are nonspecific but likely reflects sequela of mild chronic white matter microangiopathy.  There is no solid mass lesion. There is no mass effect or midline shift. Vascular: Normal flow voids. Skull and upper cervical spine: Normal marrow signal. Sinuses/Orbits: There is mild mucosal thickening in the paranasal sinuses. Bilateral lens implants are in place. The globes and orbits are otherwise unremarkable. Other: The adenoid tonsils are prominent. IMPRESSION: 1. Small acute infarcts in the left pons with no associated hemorrhage or mass effect. 2. Small remote infarct in the right pons and background mild chronic white matter microangiopathy. 3. Prominent adenoid tonsils without focal lesion. Consider nonemergent correlation with direct visualization. Electronically Signed   By: Lesia Hausen M.D.   On: 01/29/2022 13:27  ? ?ECHOCARDIOGRAM COMPLETE ? ?Result Date: 01/29/2022 ?   ECHOCARDIOGRAM REPORT   Patient Name:   Savannah Conner Date of Exam: 01/29/2022 Medical Rec #:  [id number]       Height:       62.0 in Accession #:    [id number]      Weight:       162.0 lb Date of Birth:  1966/07/09       BSA:          1.748 m? Patient Age:    25 years  BP:           127/76 mmHg Patient Gender: F               HR:           77 bpm. Exam Location:  Inpatient Procedure: 2D Echo Indications:    stroke  History:        Patient has no prior history of Echocardiogram examinations.  Sonographer:    Delcie Roch RDCS Referring Phys: [phone] DENISE A WOLFE IMPRESSIONS  1. Left ventricular ejection fraction, by estimation, is 60 to 65%. The left ventricle has normal function. The left ventricle has no regional wall motion abnormalities. There is mild left ventricular hypertrophy. Left ventricular diastolic parameters are consistent with Grade I diastolic dysfunction (impaired relaxation).  2. Right ventricular systolic function is normal. The right ventricular size is normal. Tricuspid regurgitation signal is inadequate for assessing PA pressure.  3. The mitral valve is normal in structure. No evidence of mitral valve  regurgitation. No evidence of mitral stenosis.  4. The aortic valve is tricuspid. Aortic valve regurgitation is not visualized. No aortic stenosis is present.  5. The inferior vena cava is normal in size

## 2022-01-30 NOTE — Evaluation (Signed)
Speech Language Pathology Evaluation ?Patient Details ?Name: Savannah Conner ?MRN: [id number] ?DOB: 04/03/1966 ?Today's Date: 01/30/2022 ?Time: 1058-  ?  ? ?Problem List:  ?Patient Active Problem List  ? Diagnosis Date Noted  ? Left pontine stroke ([facility]) 01/29/2022  ? ?Past Medical History:  ?Past Medical History:  ?Diagnosis Date  ? Diabetes mellitus without complication ([facility])   ? ?Past Surgical History: History reviewed. No pertinent surgical history. ?HPI:  ?56 y/o female admitted from an optho appointment for diabetic retinopathy after developing expressive aphasia and weak legs.  MRI showed small acute infarcts in the left pons.  Also noted small remote infarct inthe right pons.  PMHx: DM  ? ?Assessment / Plan / Recommendation ?Clinical Impression ? Pt presents with normal expressive language and comprehension. Speech is clear and fluent.  No focal CN deficits. She expresses novel ideas well without diffuculty. Symptoms have resolved. No SLP needs identified - our service will sign off. ?   ?SLP Assessment ? SLP Recommendation/Assessment: Patient does not need any further Speech Lanaguage Pathology Services ?SLP Visit Diagnosis: Cognitive communication deficit (R41.841)  ?  ?Recommendations for follow up therapy are one component of a multi-disciplinary discharge planning process, led by the attending physician.  Recommendations may be updated based on patient status, additional functional criteria and insurance authorization. ?   ?Follow Up Recommendations ? No SLP follow up  ?  ?    ?    ?    ?  ?  ?   ?SLP Evaluation ?Cognition ? Overall Cognitive Status: Within Functional Limits for tasks assessed ?Orientation Level: Oriented X4  ?  ?   ?Comprehension ? Auditory Comprehension ?Overall Auditory Comprehension: Appears within functional limits for tasks assessed ?Yes/No Questions: Within Functional Limits ?Commands: Within Functional Limits ?Visual Recognition/Discrimination ?Discrimination: Within Function  Limits ?Reading Comprehension ?Reading Status: Within funtional limits  ?  ?Expression Expression ?Primary Mode of Expression: Verbal ?Verbal Expression ?Overall Verbal Expression: Appears within functional limits for tasks assessed ?Level of Generative/Spontaneous Verbalization: Conversation ?Repetition: No impairment ?Naming: No impairment ?Pragmatics: No impairment ?Written Expression ?Dominant Hand: Right ?Written Expression: Within Functional Limits   ?Oral / Motor ? Oral Motor/Sensory Function ?Overall Oral Motor/Sensory Function: Within functional limits ?Motor Speech ?Overall Motor Speech: Appears within functional limits for tasks assessed   ?        ? ?Blenda Mounts Laurice ?01/30/2022, 11:15 AM ?Marchelle Folks L. Alylah Blakney, MA CCC/SLP ?Acute Rehabilitation Services ?Office number [phone] ?Pager [phone] ? ?

## 2022-01-30 NOTE — Progress Notes (Addendum)
Inpatient Diabetes Program Recommendations ? ?AACE/ADA: New Consensus Statement on Inpatient Glycemic Control (2015) ? ?Target Ranges:  Prepandial:   less than 140 mg/dL ?     Peak postprandial:   less than 180 mg/dL (1-2 hours) ?     Critically ill patients:  140 - 180 mg/dL  ? ?Lab Results  ?Component Value Date  ? GLUCAP 266 (H) 01/30/2022  ? HGBA1C 12.4 (H) 01/30/2022  ? ? ?Review of Glycemic Control ? Latest Reference Range & Units 01/29/22 12:23 01/29/22 16:45 01/29/22 21:55 01/30/22 06:03  ?Glucose-Capillary 70 - 99 mg/dL 824 (H) 235 (H) 361 (H) 266 (H)  ? ?Diabetes history: DM 2 ?Outpatient Diabetes medications: Metformin prn ?Current orders for Inpatient glycemic control:  ?Semglee 10 units ?Novolog 0-9 units tid ? ?A1c 12.4% on 01/30/2022 ? ?Inpatient Diabetes Program Recommendations:   ? ?Fasting glucose 266 this am ? ?-  Increase Semglee to 18 units ? ?Will see pt ? ?Addendum: ? ?Spoke with pt at bedside regarding A1c and glucose control at home. Pt reports gaining some of her weight loss back. Pt thought her meter was not working right but got it about a year ago. Pt is familiar with insulin and has been on it in the past. Pt reports being prescribed a GLP but has not started it yet. I told her it would be a good medication to take with insulin. Discussed lifestyle modification through diet and exercise and discussed how she needs to check her glucose levels fasting and later in the day. Depending on whether she requires meal coverage insulin would determine if a 70/30 would be more appropriate at d/c due to cost. ? ?Pt to try to use her meter again and if number are still up and down she knows to buy another one from [facility]. ? ?Thanks, ?Christena Deem RN, MSN, BC-ADM ?Inpatient Diabetes Coordinator ?Team Pager [phone] (8a-5p) ?

## 2022-01-30 NOTE — Plan of Care (Signed)
?  Problem: Education: ?Goal: Knowledge of disease or condition will improve ?Outcome: Progressing ?Goal: Knowledge of secondary prevention will improve (SELECT ALL) ?Outcome: Progressing ?Goal: Knowledge of patient specific risk factors will improve (INDIVIDUALIZE FOR PATIENT) ?Outcome: Progressing ?Goal: Individualized Educational Video(s) ?Outcome: Progressing ?  ?Problem: Coping: ?Goal: Will verbalize positive feelings about self ?Outcome: Progressing ?Goal: Will identify appropriate support needs ?Outcome: Progressing ?  ?Problem: Health Behavior/Discharge Planning: ?Goal: Ability to manage health-related needs will improve ?Outcome: Progressing ?  ?Problem: Self-Care: ?Goal: Ability to participate in self-care as condition permits will improve ?Outcome: Progressing ?Goal: Verbalization of feelings and concerns over difficulty with self-care will improve ?Outcome: Progressing ?  ?Problem: Nutrition: ?Goal: Risk of aspiration will decrease ?Outcome: Progressing ?  ?Problem: Intracerebral Hemorrhage Tissue Perfusion: ?Goal: Complications of Intracerebral Hemorrhage will be minimized ?Outcome: Progressing ?  ?Problem: Education: ?Goal: Knowledge of patient specific risk factors will improve (INDIVIDUALIZE FOR PATIENT) ?Outcome: Progressing ?  ?

## 2022-01-30 NOTE — Discharge Instructions (Signed)
Glucose Products:  ReliOn™ glucose products raise low blood sugar fast. Tablets are free of fat, caffeine, sodium and gluten. They are portable and easy to carry, making it easier for people with diabetes to BE PREPARED for lows. ° °Glucose Tablets °Available in 6 flavors °• 10 ct...................................... $1.00 °• 50 ct...................................... $3.98 °Glucose Shot..................................$1.48 °Glucose Gel....................................$3.44 ° °Alcohol Swabs °Alcohol swabs are used to sterilize your injection site. All of our swabs are individually wrapped for maximum safety, convenience and moisture retention. °ReliOn™ Alcohol Swabs °• 100 ct Swabs..............................$1.00 °• 400 ct Swabs..............................$3.74 ° °Lancets °ReliOn™ offers three lancet options conveniently designed to work with almost every lancing device. Each features a protective disk, which guarantees sterility before testing. °ReliOn™ Lancets °• 100 ct Lancets $1.56 °• 200 ct Lancets $2.64 °Available in Ultra-Thin, Thin & Micro-Thin °ReliOn™ 2-IN-1 Lancing Device °• 50 ct Lancets..................................... $3.44 °Available in 30 gauge and 25 gauge °ReliOn™ Lancing Device....................$5.84 ° °Blood Glucose Monitors °ReliOn™ offers a full range of blood glucose testing options to provide an accurate, affordable °system that meets each person's unique needs and preferences. °Prime Meter....................................... $9.00 °Prime Test Strips °• 25 test strips.................................... $5.00 °• 50 test strips.................................... $9.00 °• 100 test strips.................................$17.88 °Premier BLU Meter  ………………………………  $18.98 °Premier Voice Meter  ……………………………….  $14.98 °Premier Test Strips °• 50 test strips.................................... $9.00 °• 100 test strips.................................$17.88 °Premier Compact Meter Kit   ………………………………  $19.44 °Kit includes: °• 50 test strips °• 10 lancets °• Lancing device °• Carry case ° °Ketone Test Strips °• 50 test strips  ……………………………………..  $6.64 ° °Human Insulin  °Novolin®/ReliOn™ (recombinant DNA origin) is manufactured for [employer] by Novo Nordisk °Novolin®/ReliOn™ Insulin* with Vial..........$24.88 °Available in N, R, 70/30 °Novolin®/ReliOn™ Insulin Pens*  ……………  $42.88 °Available in N, R, 70/30 ° °Insulin Delivery °ReliOn™ syringes and pen needles provide precision technology, comfort and accuracy in °insulin delivery at affordable prices. °ReliOn™ Pen Needles* °• 50 ct....................................................$9.00 °Available in 4mm, 6mm, 8mm & 12mm °ReliOn™ Insulin Syringes*  °• 100 ct ……………………………. $12.58 °Available in 29G, 30G & 31G (3/10cc, 1/2cc & 1cc units) °

## 2022-01-31 ENCOUNTER — Encounter (HOSPITAL_COMMUNITY): Payer: Self-pay | Admitting: *Deleted

## 2022-01-31 ENCOUNTER — Other Ambulatory Visit (HOSPITAL_COMMUNITY): Payer: Self-pay

## 2022-01-31 LAB — BASIC METABOLIC PANEL
Anion gap: 6 (ref 5–15)
BUN: 9 mg/dL (ref 6–20)
CO2: 22 mmol/L (ref 22–32)
Calcium: 8.6 mg/dL — ABNORMAL LOW (ref 8.9–10.3)
Chloride: 108 mmol/L (ref 98–111)
Creatinine, Ser: 0.46 mg/dL (ref 0.44–1.00)
GFR, Estimated: >60 mL/min (ref >60)
Glucose, Bld: 254 mg/dL — ABNORMAL HIGH (ref 70–99)
Potassium: 3.5 mmol/L (ref 3.5–5.1)
Sodium: 136 mmol/L (ref 135–145)

## 2022-01-31 LAB — GLUCOSE, CAPILLARY
Glucose-Capillary: 299 mg/dL — ABNORMAL HIGH (ref 70–99)
Glucose-Capillary: 281 mg/dL — ABNORMAL HIGH (ref 70–99)

## 2022-01-31 MED ORDER — INSULIN GLARGINE 100 UNIT/ML SOLOSTAR PEN
20.0000 [IU] | PEN_INJECTOR | Freq: Every day | SUBCUTANEOUS | 0 refills | Status: DC
  Filled 2022-01-31: qty 6, 30d supply, fill #0

## 2022-01-31 MED ORDER — INSULIN GLARGINE 100 UNIT/ML SOLOSTAR PEN
18.0000 [IU] | PEN_INJECTOR | Freq: Every day | SUBCUTANEOUS | 0 refills | Status: DC
  Filled 2022-01-31: qty 30, 166d supply, fill #0

## 2022-01-31 MED ORDER — INSULIN PEN NEEDLE 32G X 4 MM MISC
0 refills | Status: DC
  Filled 2022-01-31: qty 100, 25d supply, fill #0

## 2022-01-31 MED ORDER — ASPIRIN 81 MG PO TBEC
81.0000 mg | DELAYED_RELEASE_TABLET | Freq: Every day | ORAL | 0 refills | Status: DC
  Filled 2022-01-31: qty 18, 18d supply, fill #0

## 2022-01-31 MED ORDER — CLOPIDOGREL BISULFATE 75 MG PO TABS
75.0000 mg | ORAL_TABLET | Freq: Every day | ORAL | 0 refills | Status: DC
  Filled 2022-01-31: qty 30, 30d supply, fill #0

## 2022-01-31 MED ORDER — ATORVASTATIN CALCIUM 80 MG PO TABS
80.0000 mg | ORAL_TABLET | Freq: Every day | ORAL | 0 refills | Status: DC
  Filled 2022-01-31: qty 30, 30d supply, fill #0

## 2022-01-31 NOTE — Progress Notes (Unsigned)
? ?Established Patient Office Visit ? ?Subjective   ?Patient ID: Savannah Conner, female    DOB: 02/21/1966  Age: 56 y.o. MRN: [id number] ? ?No chief complaint on file. ? ? ?HPI ? ?{History (Optional):23778} ? ?ROS ? ?  ?Objective:  ?  ? ?There were no vitals taken for this visit. ?{Vitals History (Optional):23777} ? ?Physical Exam ? ? ?Results for orders placed or performed during the hospital encounter of 01/29/22  ?Resp Panel by RT-PCR (Flu A&B, Covid) Nasopharyngeal Swab  ? Specimen: Nasopharyngeal Swab; Nasopharyngeal(NP) swabs in vial transport medium  ?Result Value Ref Range  ? SARS Coronavirus 2 by RT PCR NEGATIVE NEGATIVE  ? Influenza A by PCR NEGATIVE NEGATIVE  ? Influenza B by PCR NEGATIVE NEGATIVE  ?Protime-INR  ?Result Value Ref Range  ? Prothrombin Time 12.4 11.4 - 15.2 seconds  ? INR 0.9 0.8 - 1.2  ?APTT  ?Result Value Ref Range  ? aPTT 23 (L) 24 - 36 seconds  ?CBC  ?Result Value Ref Range  ? WBC 7.9 4.0 - 10.5 K/uL  ? RBC 4.27 3.87 - 5.11 MIL/uL  ? Hemoglobin 13.1 12.0 - 15.0 g/dL  ? HCT 39.4 36.0 - 46.0 %  ? MCV 92.3 80.0 - 100.0 fL  ? MCH 30.7 26.0 - 34.0 pg  ? MCHC 33.2 30.0 - 36.0 g/dL  ? RDW 11.3 (L) 11.5 - 15.5 %  ? Platelets 255 150 - 400 K/uL  ? nRBC 0.0 0.0 - 0.2 %  ?Differential  ?Result Value Ref Range  ? Neutrophils Relative % 64 %  ? Neutro Abs 5.0 1.7 - 7.7 K/uL  ? Lymphocytes Relative 28 %  ? Lymphs Abs 2.2 0.7 - 4.0 K/uL  ? Monocytes Relative 4 %  ? Monocytes Absolute 0.3 0.1 - 1.0 K/uL  ? Eosinophils Relative 3 %  ? Eosinophils Absolute 0.3 0.0 - 0.5 K/uL  ? Basophils Relative 1 %  ? Basophils Absolute 0.0 0.0 - 0.1 K/uL  ? Immature Granulocytes 0 %  ? Abs Immature Granulocytes 0.03 0.00 - 0.07 K/uL  ?Comprehensive metabolic panel  ?Result Value Ref Range  ? Sodium 135 135 - 145 mmol/L  ? Potassium 4.0 3.5 - 5.1 mmol/L  ? Chloride 105 98 - 111 mmol/L  ? CO2 21 (L) 22 - 32 mmol/L  ? Glucose, Bld 486 (H) 70 - 99 mg/dL  ? BUN 19 6 - 20 mg/dL  ? Creatinine, Ser 0.69 0.44 - 1.00 mg/dL  ?  Calcium 8.8 (L) 8.9 - 10.3 mg/dL  ? Total Protein 6.9 6.5 - 8.1 g/dL  ? Albumin 3.5 3.5 - 5.0 g/dL  ? AST 13 (L) 15 - 41 U/L  ? ALT 15 0 - 44 U/L  ? Alkaline Phosphatase 79 38 - 126 U/L  ? Total Bilirubin 0.6 0.3 - 1.2 mg/dL  ? GFR, Estimated >60 >60 mL/min  ? Anion gap 9 5 - 15  ?Urine rapid drug screen (hosp performed)not at [facility]  ?Result Value Ref Range  ? Opiates NONE DETECTED NONE DETECTED  ? Cocaine NONE DETECTED NONE DETECTED  ? Benzodiazepines NONE DETECTED NONE DETECTED  ? Amphetamines NONE DETECTED NONE DETECTED  ? Tetrahydrocannabinol NONE DETECTED NONE DETECTED  ? Barbiturates NONE DETECTED NONE DETECTED  ?Urinalysis, Routine w reflex microscopic  ?Result Value Ref Range  ? Color, Urine STRAW (A) YELLOW  ? APPearance CLEAR CLEAR  ? Specific Gravity, Urine 1.044 (H) 1.005 - 1.030  ? pH 5.0 5.0 - 8.0  ? Glucose, UA >=500 (  A) NEGATIVE mg/dL  ? Hgb urine dipstick NEGATIVE NEGATIVE  ? Bilirubin Urine NEGATIVE NEGATIVE  ? Ketones, ur NEGATIVE NEGATIVE mg/dL  ? Protein, ur NEGATIVE NEGATIVE mg/dL  ? Nitrite NEGATIVE NEGATIVE  ? Leukocytes,Ua SMALL (A) NEGATIVE  ? RBC / HPF 0-5 0 - 5 RBC/hpf  ? WBC, UA 0-5 0 - 5 WBC/hpf  ? Bacteria, UA NONE SEEN NONE SEEN  ? Squamous Epithelial / LPF 0-5 0 - 5  ?HIV Antibody (routine testing w rflx)  ?Result Value Ref Range  ? HIV Screen 4th Generation wRfx Non Reactive Non Reactive  ?Hemoglobin A1c  ?Result Value Ref Range  ? Hgb A1c MFr Bld 12.4 (H) 4.8 - 5.6 %  ? Mean Plasma Glucose 309.18 mg/dL  ?Lipid panel  ?Result Value Ref Range  ? Cholesterol 290 (H) 0 - 200 mg/dL  ? Triglycerides 505 (H) <150 mg/dL  ? HDL 27 (L) >40 mg/dL  ? Total CHOL/HDL Ratio 10.7 RATIO  ? VLDL UNABLE TO CALCULATE IF TRIGLYCERIDE OVER 400 mg/dL 0 - 40 mg/dL  ? LDL Cholesterol UNABLE TO CALCULATE IF TRIGLYCERIDE OVER 400 mg/dL 0 - 99 mg/dL  ?CBC  ?Result Value Ref Range  ? WBC 7.2 4.0 - 10.5 K/uL  ? RBC 4.04 3.87 - 5.11 MIL/uL  ? Hemoglobin 12.4 12.0 - 15.0 g/dL  ? HCT 36.2 36.0 - 46.0 %  ? MCV 89.6  80.0 - 100.0 fL  ? MCH 30.7 26.0 - 34.0 pg  ? MCHC 34.3 30.0 - 36.0 g/dL  ? RDW 11.5 11.5 - 15.5 %  ? Platelets 256 150 - 400 K/uL  ? nRBC 0.0 0.0 - 0.2 %  ?Glucose, capillary  ?Result Value Ref Range  ? Glucose-Capillary 320 (H) 70 - 99 mg/dL  ?Basic metabolic panel  ?Result Value Ref Range  ? Sodium 137 135 - 145 mmol/L  ? Potassium 3.7 3.5 - 5.1 mmol/L  ? Chloride 110 98 - 111 mmol/L  ? CO2 20 (L) 22 - 32 mmol/L  ? Glucose, Bld 287 (H) 70 - 99 mg/dL  ? BUN 14 6 - 20 mg/dL  ? Creatinine, Ser 0.43 (L) 0.44 - 1.00 mg/dL  ? Calcium 8.2 (L) 8.9 - 10.3 mg/dL  ? GFR, Estimated >60 >60 mL/min  ? Anion gap 7 5 - 15  ?LDL cholesterol, direct  ?Result Value Ref Range  ? Direct LDL 169.0 (H) 0 - 99 mg/dL  ?Glucose, capillary  ?Result Value Ref Range  ? Glucose-Capillary 266 (H) 70 - 99 mg/dL  ?Glucose, capillary  ?Result Value Ref Range  ? Glucose-Capillary 356 (H) 70 - 99 mg/dL  ? Comment 1 Notify RN   ? Comment 2 Document in Chart   ?Glucose, capillary  ?Result Value Ref Range  ? Glucose-Capillary 166 (H) 70 - 99 mg/dL  ? Comment 1 Notify RN   ? Comment 2 Document in Chart   ?Basic metabolic panel  ?Result Value Ref Range  ? Sodium 136 135 - 145 mmol/L  ? Potassium 3.5 3.5 - 5.1 mmol/L  ? Chloride 108 98 - 111 mmol/L  ? CO2 22 22 - 32 mmol/L  ? Glucose, Bld 254 (H) 70 - 99 mg/dL  ? BUN 9 6 - 20 mg/dL  ? Creatinine, Ser 0.46 0.44 - 1.00 mg/dL  ? Calcium 8.6 (L) 8.9 - 10.3 mg/dL  ? GFR, Estimated >60 >60 mL/min  ? Anion gap 6 5 - 15  ?Glucose, capillary  ?Result Value Ref Range  ? Glucose-Capillary 214 (  H) 70 - 99 mg/dL  ?Glucose, capillary  ?Result Value Ref Range  ? Glucose-Capillary 299 (H) 70 - 99 mg/dL  ?Glucose, capillary  ?Result Value Ref Range  ? Glucose-Capillary 281 (H) 70 - 99 mg/dL  ?I-stat chem 8, ED  ?Result Value Ref Range  ? Sodium 136 135 - 145 mmol/L  ? Potassium 4.0 3.5 - 5.1 mmol/L  ? Chloride 104 98 - 111 mmol/L  ? BUN 21 (H) 6 - 20 mg/dL  ? Creatinine, Ser 0.50 0.44 - 1.00 mg/dL  ? Glucose, Bld 486 (H)  70 - 99 mg/dL  ? Calcium, Ion 1.10 (L) 1.15 - 1.40 mmol/L  ? TCO2 23 22 - 32 mmol/L  ? Hemoglobin 13.6 12.0 - 15.0 g/dL  ? HCT 40.0 36.0 - 46.0 %  ?I-Stat beta hCG blood, ED  ?Result Value Ref Range  ? I-stat hCG, quantitative <5.0 <5 mIU/mL  ? Comment 3          ?CBG monitoring, ED  ?Result Value Ref Range  ? Glucose-Capillary 402 (H) 70 - 99 mg/dL  ?CBG monitoring, ED  ?Result Value Ref Range  ? Glucose-Capillary 350 (H) 70 - 99 mg/dL  ?ECHOCARDIOGRAM COMPLETE  ?Result Value Ref Range  ? Weight 2,592 oz  ? Height 62 in  ? BP 127/76 mmHg  ? S' Lateral 2.20 cm  ? Area-P 1/2 3.39 cm2  ? ? ?{Labs (Optional):23779} ? ?The ASCVD Risk score (Arnett DK, et al., 2019) failed to calculate for the following reasons: ?  The patient has a prior MI or stroke diagnosis ? ?  ?Assessment & Plan:  ? ?Problem List Items Addressed This Visit   ?None ? ? ?No follow-ups on file.  ? ? ?Butler-Faison,Naftula Donahue D, RN ? ?

## 2022-01-31 NOTE — Discharge Summary (Signed)
? ?Name: Savannah Conner ?MRN: [id number] ?DOB: [birth year] 56 y.o. ?PCP: Pcp, No ? ?Date of Admission: 01/29/2022 11:08 AM ?Date of Discharge: 01/31/22 ?Attending Physician: Ginnie Smart, MD ? ?Discharge Diagnosis: ?1. Acute left pontine stroke ?2. Poorly controlled T2DM with hyperglycemia ? ?Discharge Medications: ?Allergies as of 01/31/2022   ? ?   Reactions  ? Codeine Other (See Comments)  ? Burns her stomach  ? ?  ? ?  ?Medication List  ?  ? ?STOP taking these medications   ? ?azelastine 0.1 % nasal spray ?Commonly known as: ASTELIN ?  ?dicyclomine 20 MG tablet ?Commonly known as: BENTYL ?  ?ibuprofen 800 MG tablet ?Commonly known as: ADVIL ?  ?levocetirizine 5 MG tablet ?Commonly known as: XYZAL ?  ?metFORMIN 500 MG tablet ?Commonly known as: GLUCOPHAGE ?  ?metoCLOPramide 10 MG tablet ?Commonly known as: REGLAN ?  ? ?  ? ?TAKE these medications   ? ?aspirin 81 MG EC tablet ?Take 1 tablet (81 mg total) by mouth daily for 18 days. Swallow whole. ?Start taking on: February 01, 2022 ?  ?atorvastatin 80 MG tablet ?Commonly known as: LIPITOR ?Take 1 tablet (80 mg total) by mouth daily. ?Start taking on: February 01, 2022 ?  ?clopidogrel 75 MG tablet ?Commonly known as: PLAVIX ?Take 1 tablet (75 mg total) by mouth daily. ?Start taking on: February 01, 2022 ?  ?insulin glargine 100 UNIT/ML Solostar Pen ?Commonly known as: LANTUS ?Inject 20 Units into the skin at bedtime. ?  ? ?  ? ? ?Disposition and follow-up:   ?[name] was discharged from [facility] in Good condition.  At the hospital follow up visit please address: ? ?Acute left pontine stroke: ?Patient has no residual neurologic deficits.  Discharged on DAPT for 3 weeks followed by Plavix monotherapy.  Started on Lipitor 80 mg daily.  Please ensure follow-up with Savannah Conner. ?Poorly controlled T2DM with hyperglycemia: ?Patient is uninsured and has been obtaining metformin through mail-order in [place].  She has been taking  the metformin as needed.  Previously she has used insulin.  At discharge patient was sent home on 20 units Lantus Solostar pen nightly.  Please check in with patient about home CBGs and adjust insulin as needed.  Please connect her with diabetes coordinator. ?Uninsured: ?DAPT, Crestor, Lantus sent to [facility] at discharge.  Please check in with patient about her ability to obtain health insurance.  She did receive assistance from CM to set this up. ? ?Labs / imaging needed at time of follow-up: CBG ? ?Pending labs/ test needing follow-up: None ? ?Follow-up Appointments: ? Follow-up Information   ? ? [facility] Follow up.   ?Contact information: ?[address] ?[place] [address] ?[phone] ? ?  ?  ? ?  ?  ? ?  ? ? ?Hospital Course by Problem List: ? ?Makesha Belitz is a 56 year old female with past medical history of uncontrolled type 2 diabetes c/b diabetic retinopathy who presented to [facility] ED from ophthalmologist's office after acute onset of transient BLE weakness leading to difficulty ambulating and expressive aphasia found to have an acute left pontine stroke on MRI. ?  ?#Acute left pontine stroke ?#Remote R pontine stroke ?#HLD ?Patient experienced expressive aphasia and lower extremity weakness/gait abnormality during her appointment for diabetic retinopathy at ophthalmologist.  She was noted to have elevated BP to 210/65.  CTh negative but MRI brain showed acute left pontine infarct.  EKG and telemetry showing NSR.  Etiology likely small vessel disease in the setting of longstanding uncontrolled hypertension/diabetes. Patient's aphasia and gait abnormality have resolved, she has no residual deficits.  Per stroke risk stratification work-up, lipid panel and hemoglobin A1c were ordered showing LDL 169 hemoglobin A1c of 59.7%. She is now on DAPT: aspirin 81 mg daily and Plavix 75 mg daily for 3 weeks (End date: 4/8) followed by Plavix 75 mg monotherapy.  She was also  started on a high intensity statin, and nightly long-acting insulin for diabetes management.  Echo obtained and showed EF 60 to 65% and grade 1 diastolic dysfunction without signs of shunt or thrombus. Patient's blood pressure now normotensive.  Speech, PT, OT recommending no further follow-up.  She will need to follow-up with [facility] neurologic Conner. ?  ?#Poorly controlled T2DM with hyperglycemia ?#Diabetic retinopathy ?Patient without PCP since 2021, no record of A1c in chart.  Hemoglobin A1c 12.4% this admission.  Patient working on obtaining health insurance to afford medications.  Reports current home med metformin, both regular and extended release, causes diarrhea and for this reason has been using this as needed.  Even single 500 mg dose she does not tolerate due to diarrhea.  Blood sugars improved from 400s to 200s by day of discharge though no signs of DKA.  Based on her response to long-acting insulin given the night prior to discharge, we will discharge her on Lantus 20 units nightly with plan for close follow-up with [facility] as her new PCP.  Patient may only be staying in [place] until May before returning to [place], however they may extend her contract for an additional year.  Either way patient was encouraged to establish care with [facility] as her PCP until she leaves [place].  Patient will continue to follow-up with ophthalmology office outpatient for laser treatment of diabetic retinopathy. ?  ?#Elevated blood pressure, resolved ?Patient has no formal diagnosis of hypertension.  On admission BPs systolic 200+ though has now become normotensive without interventions.   ? ?Subjective on day of discharge: Patient reports no residual neurologic symptoms.  No new neurologic symptoms.  Does endorse polyuria and polydipsia.  Discussed she has had improvement in her CBGs though still high.  We will discharge her on higher dose of long-acting insulin.  Patient amenable to discharge with close  follow-up with [facility]. ? ?Discharge Exam:   ?BP (!) 112/52 (BP Location: Right Arm)   Pulse 75   Temp 98.3 ?F (36.8 ?C)   Resp 18   Ht 5\' 2"  (1.575 m)   Wt 73.5 kg   SpO2 97%   BMI 29.63 kg/m?  ?Discharge exam: ?General: Well appearing normal weight female, NAD ?HENT: normocephalic, atraumatic, external ears and nares appear normal ?EYES: conjunctiva non-erythematous, no scleral icterus ?CV: regular rate, normal rhythm, no murmurs, rubs, gallops. ?Pulmonary: normal work of breathing on RA, lungs clear to auscultation, no rales, wheezes, rhonchi ?Abdominal: non-distended, soft, non-tender to palpation, normal BS ?Skin: Warm and dry, no rashes or lesions ?Neurological: ?MS: awake, alert and oriented x3, normal speech and fund of knowledge, EOMI, face appears symmetric, tongue midline. ?Motor: moves all extremities antigravity, very very mild decrease speed with RAM ?Psych: normal affect ? ? ?Pertinent Labs, Studies, and Procedures:  ? ?  Latest Ref Rng & Units 01/30/2022  ?  2:15 AM 01/29/2022  ? 11:51 AM 01/29/2022  ? 11:40 AM  ?CBC  ?WBC 4.0 - 10.5 K/uL 7.2    7.9    ?Hemoglobin 12.0 - 15.0 g/dL 12.4  13.6   13.1    ?Hematocrit 36.0 - 46.0 % 36.2   40.0   39.4    ?Platelets 150 - 400 K/uL 256    255    ? ? ?  Latest Ref Rng & Units 01/31/2022  ?  3:34 AM 01/30/2022  ?  2:15 AM 01/29/2022  ? 11:51 AM  ?CMP  ?Glucose 70 - 99 mg/dL 604254   540287   [id number]    ?BUN 6 - 20 mg/dL 9   14   21     ?Creatinine 0.44 - 1.00 mg/dL 1.910.46   4.780.43   2.950.50    ?Sodium 135 - 145 mmol/L 136   137   136    ?Potassium 3.5 - 5.1 mmol/L 3.5   3.7   4.0    ?Chloride 98 - 111 mmol/L 108   110   104    ?CO2 22 - 32 mmol/L 22   20     ?Calcium 8.9 - 10.3 mg/dL 8.6   8.2     ? ? ?CT ANGIO HEAD NECK W WO CM ? ?Result Date: 01/29/2022 ?CLINICAL DATA:  TIA EXAM: CT ANGIOGRAPHY HEAD AND NECK TECHNIQUE: Multidetector CT imaging of the head and neck was performed using the standard protocol during bolus administration of intravenous contrast. Multiplanar CT image  reconstructions and MIPs were obtained to evaluate the vascular anatomy. Carotid stenosis measurements (when applicable) are obtained utilizing NASCET criteria, using the distal internal carotid diameter as the deno

## 2022-01-31 NOTE — TOC Transition Note (Addendum)
Transition of Care (TOC) - CM/SW Discharge Note ? ? ?Patient Details  ?Name: Savannah Conner ?MRN: [id number] ?Date of Birth: January 11, 1966 ? ?Transition of Care (TOC) CM/SW Contact:  ?Pollie Friar, RN ?Phone Number: ?01/31/2022, 10:54 AM ? ? ?Clinical Narrative:    ?Patient is discharging home with self care. No f/u per PT/OT.  ?Patient medications for home to be delivered to the room per [place]. CM provided a MATCH to assist with the cost of her d/c medications. ?CM provided a cab voucher for transport home.  ? ? ?Final next level of care: Home/Self Care ?Barriers to Discharge: No Barriers Identified ? ? ?Patient Goals and CMS Choice ?  ?  ?Choice offered to / list presented to : Patient ? ?Discharge Placement ?  ?           ?  ?  ?  ?  ? ?Discharge Plan and Services ?  ?Discharge Planning Services: CM Consult ?           ?  ?  ?  ?  ?  ?  ?  ?  ?  ?  ? ?Social Determinants of Health (SDOH) Interventions ?  ? ? ?Readmission Risk Interventions ?   ? View : No data to display.  ?  ?  ?  ? ? ? ? ? ?

## 2022-01-31 NOTE — Progress Notes (Signed)
Inpatient Diabetes Program Recommendations ? ?AACE/ADA: New Consensus Statement on Inpatient Glycemic Control (2015) ? ?Target Ranges:  Prepandial:   less than 140 mg/dL ?     Peak postprandial:   less than 180 mg/dL (1-2 hours) ?     Critically ill patients:  140 - 180 mg/dL  ? ?Lab Results  ?Component Value Date  ? GLUCAP 299 (H) 01/31/2022  ? HGBA1C 12.4 (H) 01/30/2022  ? ? ?Review of Glycemic Control ? Latest Reference Range & Units 01/30/22 06:03 01/30/22 12:28 01/30/22 16:01 01/30/22 21:22 01/31/22 06:20  ?Glucose-Capillary 70 - 99 mg/dL 259 (H) 563 (H) 875 (H) 214 (H) 299 (H)  ? ?Diabetes history: DM 2 ?Outpatient Diabetes medications: Metformin prn ?Current orders for Inpatient glycemic control:  ?Semglee 15 units qhs ?Novolog 0-9 units tid ? ?A1c 12.4% on 01/30/2022 ? ?Inpatient Diabetes Program Recommendations:   ? ?Fasting glucose 299 this am ? ?-  Increase Semglee to 22 units ? ?Depending on whether she requires meal coverage insulin would determine if a 70/30 would be more appropriate at d/c due to cost. ? ? ?Thanks, ?Christena Deem RN, MSN, BC-ADM ?Inpatient Diabetes Coordinator ?Team Pager [phone] (8a-5p) ?

## 2022-01-31 NOTE — Plan of Care (Signed)
?  Problem: Education: ?Goal: Knowledge of disease or condition will improve ?Outcome: Progressing ?Goal: Knowledge of secondary prevention will improve (SELECT ALL) ?Outcome: Progressing ?Goal: Knowledge of patient specific risk factors will improve (INDIVIDUALIZE FOR PATIENT) ?Outcome: Progressing ?Goal: Individualized Educational Video(s) ?Outcome: Progressing ?  ?Problem: Coping: ?Goal: Will verbalize positive feelings about self ?Outcome: Progressing ?Goal: Will identify appropriate support needs ?Outcome: Progressing ?  ?Problem: Health Behavior/Discharge Planning: ?Goal: Ability to manage health-related needs will improve ?Outcome: Progressing ?  ?Problem: Self-Care: ?Goal: Ability to participate in self-care as condition permits will improve ?Outcome: Progressing ?Goal: Verbalization of feelings and concerns over difficulty with self-care will improve ?Outcome: Progressing ?  ?Problem: Nutrition: ?Goal: Risk of aspiration will decrease ?Outcome: Progressing ?  ?Problem: Intracerebral Hemorrhage Tissue Perfusion: ?Goal: Complications of Intracerebral Hemorrhage will be minimized ?Outcome: Progressing ?  ?Problem: Education: ?Goal: Knowledge of patient specific risk factors will improve (INDIVIDUALIZE FOR PATIENT) ?Outcome: Progressing ?  ?

## 2022-02-06 ENCOUNTER — Encounter (HOSPITAL_COMMUNITY): Payer: Self-pay | Admitting: Emergency Medicine

## 2022-02-06 ENCOUNTER — Inpatient Hospital Stay (HOSPITAL_COMMUNITY)
Admission: EM | Admit: 2022-02-06 | Discharge: 2022-02-08 | DRG: 065 | Disposition: A | Discharge status: Home / Self Care | Payer: Self-pay | Source: Ambulatory Visit | Attending: Internal Medicine | Admitting: Internal Medicine

## 2022-02-06 ENCOUNTER — Encounter: Payer: Self-pay | Admitting: Internal Medicine

## 2022-02-06 ENCOUNTER — Emergency Department (HOSPITAL_COMMUNITY): Payer: Self-pay

## 2022-02-06 ENCOUNTER — Other Ambulatory Visit: Payer: Self-pay

## 2022-02-06 DIAGNOSIS — E1165 Type 2 diabetes mellitus with hyperglycemia: Secondary | ICD-10-CM | POA: Diagnosis present

## 2022-02-06 DIAGNOSIS — Z8249 Family history of ischemic heart disease and other diseases of the circulatory system: Secondary | ICD-10-CM

## 2022-02-06 DIAGNOSIS — Z87891 Personal history of nicotine dependence: Secondary | ICD-10-CM

## 2022-02-06 DIAGNOSIS — M5481 Occipital neuralgia: Secondary | ICD-10-CM | POA: Diagnosis present

## 2022-02-06 DIAGNOSIS — R29702 NIHSS score 2: Secondary | ICD-10-CM | POA: Diagnosis present

## 2022-02-06 DIAGNOSIS — I10 Essential (primary) hypertension: Secondary | ICD-10-CM | POA: Diagnosis present

## 2022-02-06 DIAGNOSIS — Z597 Insufficient social insurance and welfare support: Secondary | ICD-10-CM

## 2022-02-06 DIAGNOSIS — E11319 Type 2 diabetes mellitus with unspecified diabetic retinopathy without macular edema: Secondary | ICD-10-CM | POA: Diagnosis present

## 2022-02-06 DIAGNOSIS — E785 Hyperlipidemia, unspecified: Secondary | ICD-10-CM | POA: Diagnosis present

## 2022-02-06 DIAGNOSIS — E669 Obesity, unspecified: Secondary | ICD-10-CM | POA: Diagnosis present

## 2022-02-06 DIAGNOSIS — Z6829 Body mass index (BMI) 29.0-29.9, adult: Secondary | ICD-10-CM

## 2022-02-06 DIAGNOSIS — Z7902 Long term (current) use of antithrombotics/antiplatelets: Secondary | ICD-10-CM

## 2022-02-06 DIAGNOSIS — Z79899 Other long term (current) drug therapy: Secondary | ICD-10-CM

## 2022-02-06 DIAGNOSIS — R29898 Other symptoms and signs involving the musculoskeletal system: Secondary | ICD-10-CM

## 2022-02-06 DIAGNOSIS — R4701 Aphasia: Secondary | ICD-10-CM | POA: Diagnosis present

## 2022-02-06 DIAGNOSIS — I6329 Cerebral infarction due to unspecified occlusion or stenosis of other precerebral arteries: Principal | ICD-10-CM | POA: Diagnosis present

## 2022-02-06 DIAGNOSIS — G8191 Hemiplegia, unspecified affecting right dominant side: Secondary | ICD-10-CM | POA: Diagnosis present

## 2022-02-06 DIAGNOSIS — I639 Cerebral infarction, unspecified: Principal | ICD-10-CM

## 2022-02-06 DIAGNOSIS — Z20822 Contact with and (suspected) exposure to covid-19: Secondary | ICD-10-CM | POA: Diagnosis present

## 2022-02-06 DIAGNOSIS — Z7982 Long term (current) use of aspirin: Secondary | ICD-10-CM

## 2022-02-06 DIAGNOSIS — Z833 Family history of diabetes mellitus: Secondary | ICD-10-CM

## 2022-02-06 DIAGNOSIS — I6381 Other cerebral infarction due to occlusion or stenosis of small artery: Secondary | ICD-10-CM | POA: Insufficient documentation

## 2022-02-06 DIAGNOSIS — R32 Unspecified urinary incontinence: Secondary | ICD-10-CM | POA: Diagnosis present

## 2022-02-06 HISTORY — DX: Cerebral infarction, unspecified: I63.9

## 2022-02-06 LAB — URINALYSIS, ROUTINE W REFLEX MICROSCOPIC
Bilirubin Urine: NEGATIVE
Glucose, UA: >=500 mg/dL — AB
Hgb urine dipstick: NEGATIVE
Ketones, ur: 5 mg/dL — AB
Nitrite: NEGATIVE
Protein, ur: 30 mg/dL — AB
Specific Gravity, Urine: 1.02 (ref 1.005–1.030)
pH: 5 (ref 5.0–8.0)

## 2022-02-06 LAB — DIFFERENTIAL
Abs Immature Granulocytes: 0.04 10*3/uL (ref 0.00–0.07)
Basophils Absolute: 0 10*3/uL (ref 0.0–0.1)
Basophils Relative: 0 %
Eosinophils Absolute: 0.1 10*3/uL (ref 0.0–0.5)
Eosinophils Relative: 1 %
Immature Granulocytes: 0 %
Lymphocytes Relative: 22 %
Lymphs Abs: 2.2 10*3/uL (ref 0.7–4.0)
Monocytes Absolute: 0.4 10*3/uL (ref 0.1–1.0)
Monocytes Relative: 4 %
Neutro Abs: 7.2 10*3/uL (ref 1.7–7.7)
Neutrophils Relative %: 73 %

## 2022-02-06 LAB — COMPREHENSIVE METABOLIC PANEL
ALT: 21 U/L (ref 0–44)
AST: 16 U/L (ref 15–41)
Albumin: 3.7 g/dL (ref 3.5–5.0)
Alkaline Phosphatase: 64 U/L (ref 38–126)
Anion gap: 10 (ref 5–15)
BUN: 12 mg/dL (ref 6–20)
CO2: 24 mmol/L (ref 22–32)
Calcium: 9 mg/dL (ref 8.9–10.3)
Chloride: 103 mmol/L (ref 98–111)
Creatinine, Ser: 0.64 mg/dL (ref 0.44–1.00)
GFR, Estimated: >60 mL/min (ref >60)
Glucose, Bld: 256 mg/dL — ABNORMAL HIGH (ref 70–99)
Potassium: 3.8 mmol/L (ref 3.5–5.1)
Sodium: 137 mmol/L (ref 135–145)
Total Bilirubin: 0.8 mg/dL (ref 0.3–1.2)
Total Protein: 6.9 g/dL (ref 6.5–8.1)

## 2022-02-06 LAB — I-STAT CHEM 8, ED
BUN: 13 mg/dL (ref 6–20)
Calcium, Ion: 1.1 mmol/L — ABNORMAL LOW (ref 1.15–1.40)
Chloride: 102 mmol/L (ref 98–111)
Creatinine, Ser: 0.5 mg/dL (ref 0.44–1.00)
Glucose, Bld: 263 mg/dL — ABNORMAL HIGH (ref 70–99)
HCT: 39 % (ref 36.0–46.0)
Hemoglobin: 13.3 g/dL (ref 12.0–15.0)
Potassium: 3.9 mmol/L (ref 3.5–5.1)
Sodium: 139 mmol/L (ref 135–145)
TCO2: 25 mmol/L (ref 22–32)

## 2022-02-06 LAB — PROTIME-INR
INR: 1 (ref 0.8–1.2)
Prothrombin Time: 13 seconds (ref 11.4–15.2)

## 2022-02-06 LAB — RAPID URINE DRUG SCREEN, HOSP PERFORMED
Amphetamines: NOT DETECTED
Barbiturates: NOT DETECTED
Benzodiazepines: NOT DETECTED
Cocaine: NOT DETECTED
Opiates: NOT DETECTED
Tetrahydrocannabinol: NOT DETECTED

## 2022-02-06 LAB — APTT: aPTT: 23 seconds — ABNORMAL LOW (ref 24–36)

## 2022-02-06 LAB — CBC
HCT: 40.2 % (ref 36.0–46.0)
Hemoglobin: 13.4 g/dL (ref 12.0–15.0)
MCH: 30.3 pg (ref 26.0–34.0)
MCHC: 33.3 g/dL (ref 30.0–36.0)
MCV: 91 fL (ref 80.0–100.0)
Platelets: 271 10*3/uL (ref 150–400)
RBC: 4.42 MIL/uL (ref 3.87–5.11)
RDW: 11.4 % — ABNORMAL LOW (ref 11.5–15.5)
WBC: 10 10*3/uL (ref 4.0–10.5)
nRBC: 0 % (ref 0.0–0.2)

## 2022-02-06 LAB — ETHANOL: Alcohol, Ethyl (B): <10 mg/dL (ref <10)

## 2022-02-06 LAB — I-STAT BETA HCG BLOOD, ED (MC, WL, AP ONLY): I-stat hCG, quantitative: <5 m[IU]/mL (ref <5)

## 2022-02-06 IMAGING — MR MR MRA HEAD W/O CM
1 series · 19 of 48 positions shown · non-contrast
Comparison: Head CT from earlier today.  Brain MRI [DATE]

CLINICAL DATA: Stroke follow-up

EXAM:
MRI HEAD WITHOUT CONTRAST
MRA HEAD WITHOUT CONTRAST
TECHNIQUE: Multiplanar, multi-echo pulse sequences of the brain and surrounding
structures were acquired without intravenous contrast. Angiographic
images of the Circle of Willis were acquired using MRA technique
without intravenous contrast.

[Series 5: 3d cow · axial · 0.5mm · 0.43mm/px · z∈[-93,+4]mm · 19 of 205 slices shown]
[im 1/205]
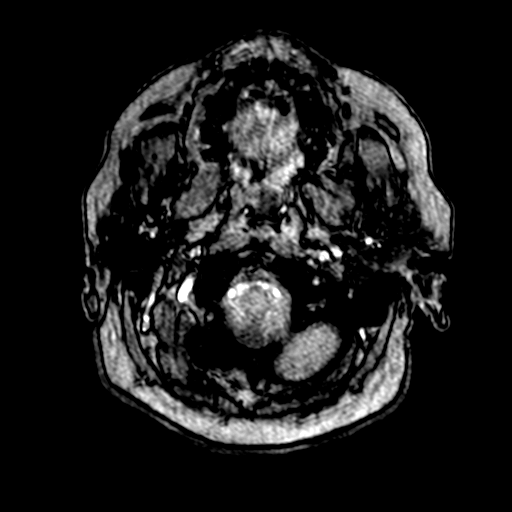
[im 5/205]
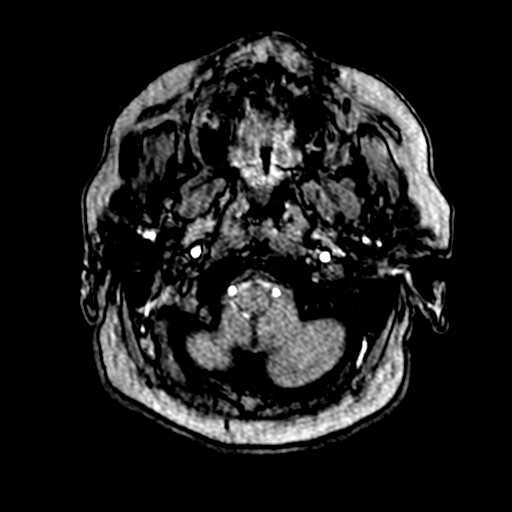
[im 9/205]
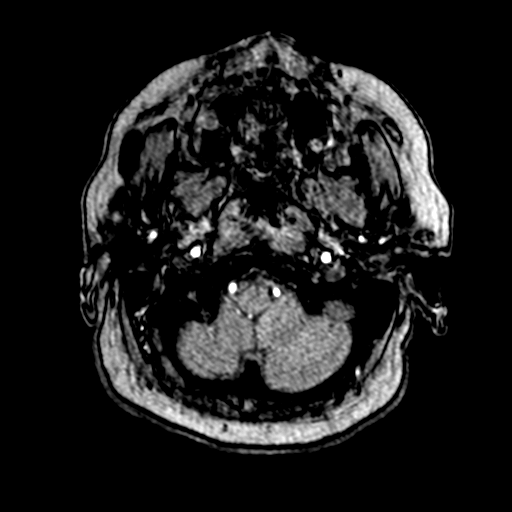
[im 14/205]
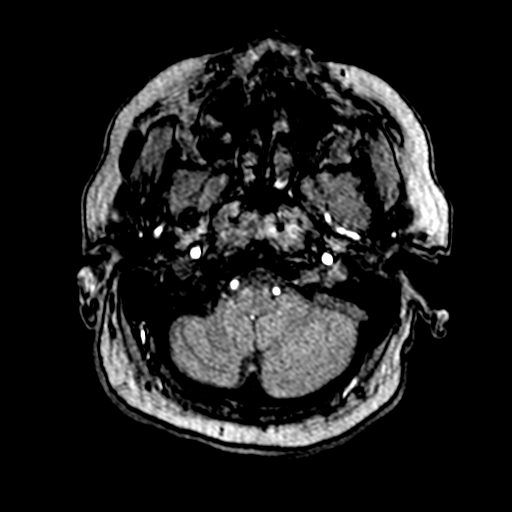
[im 18/205]
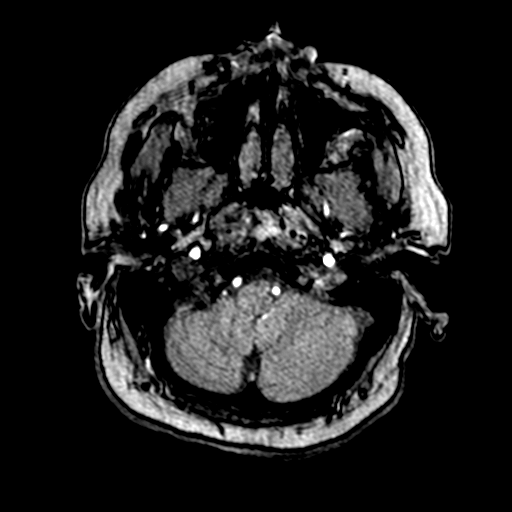
[im 22/205]
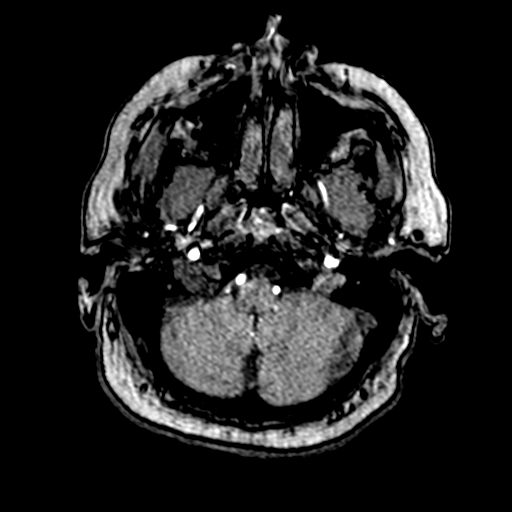
[im 27/205]
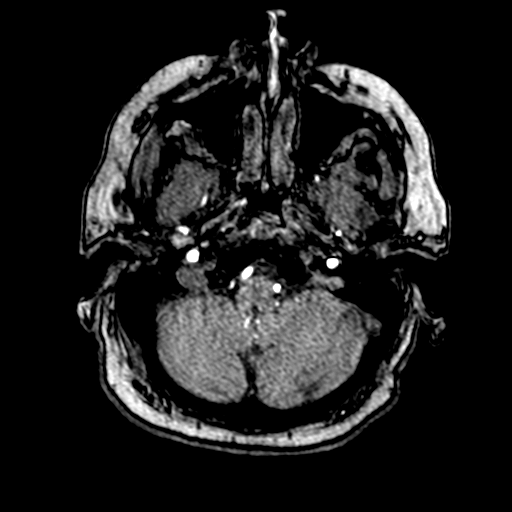
[im 31/205]
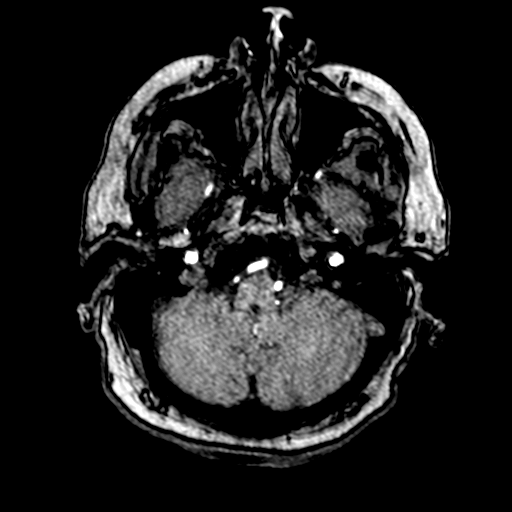
[im 35/205]
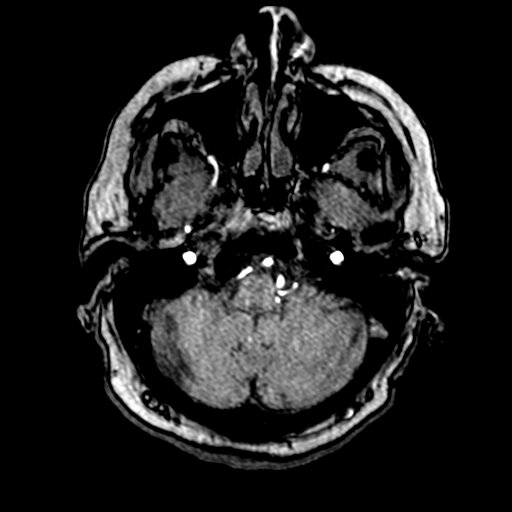
[im 40/205]
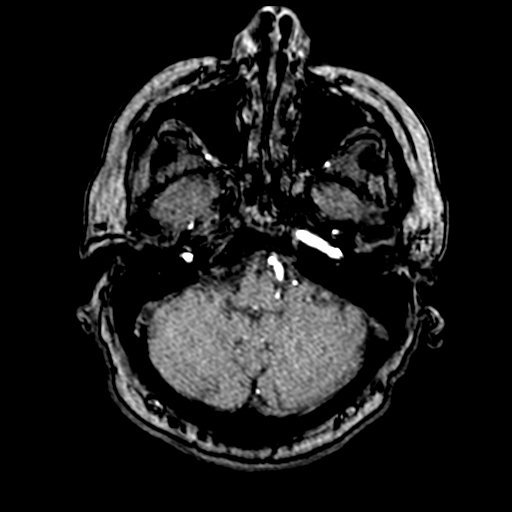
[im 44/205]
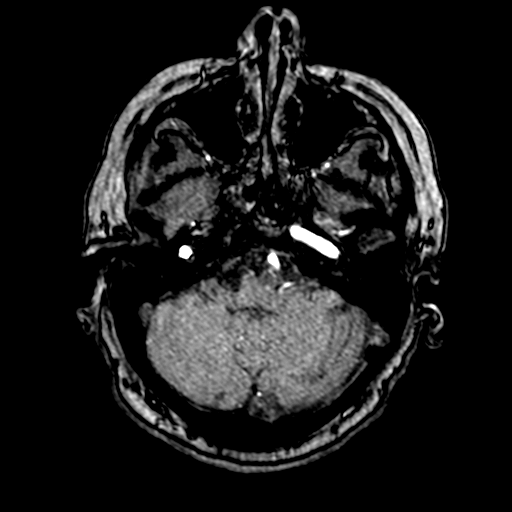
[im 66/205]
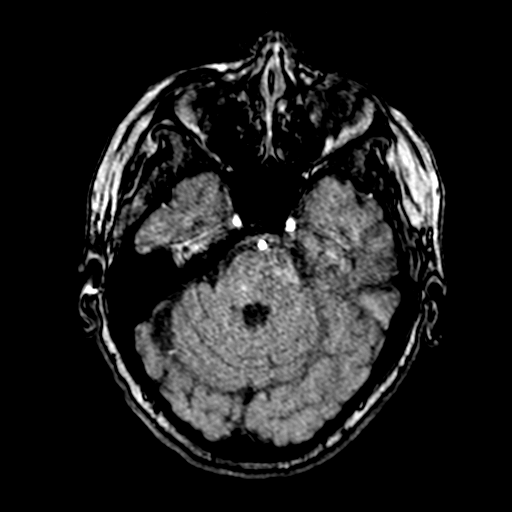
[im 92/205]
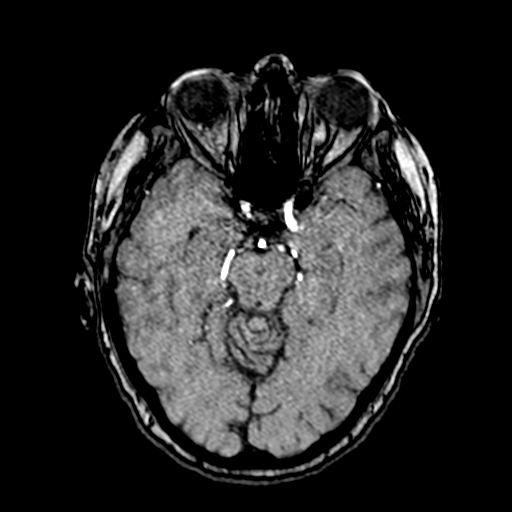
[im 105/205]
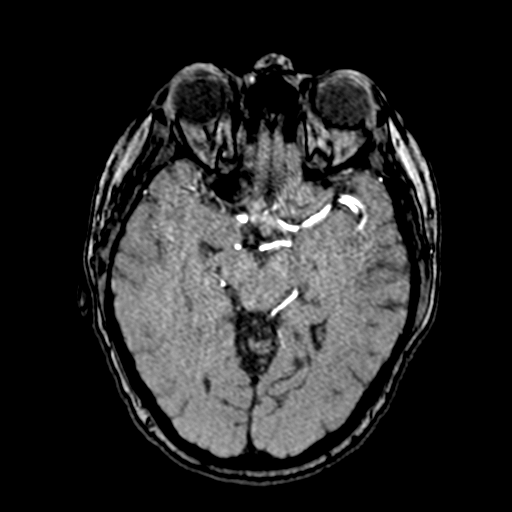
[im 118/205]
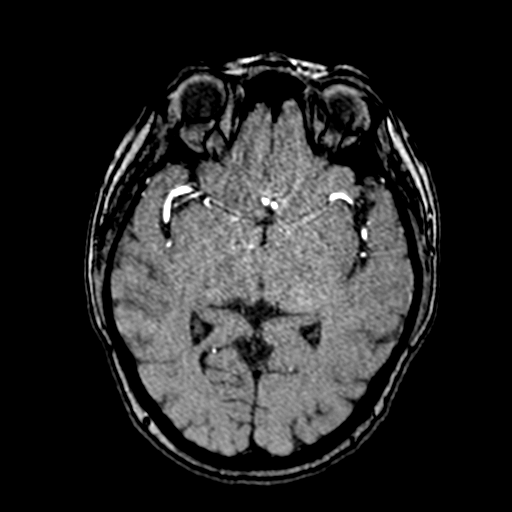
[im 144/205]
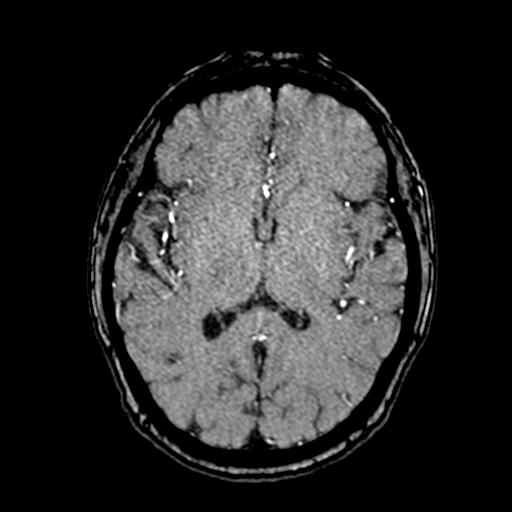
[im 170/205]
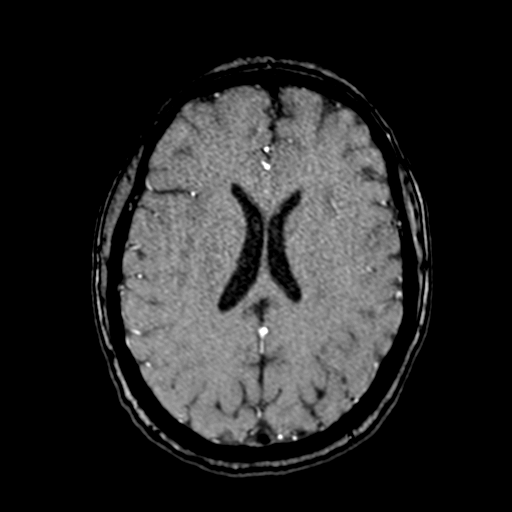
[im 174/205]
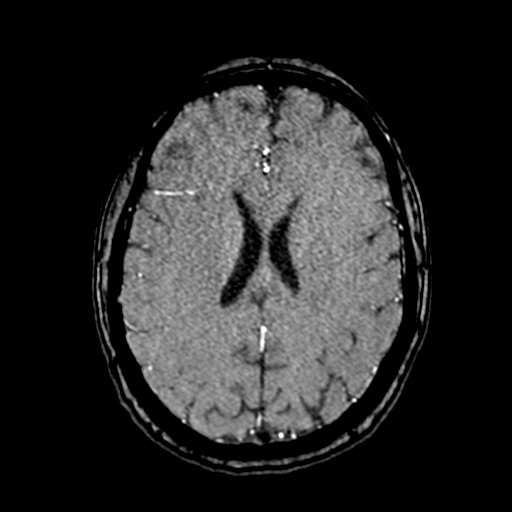
[im 196/205]
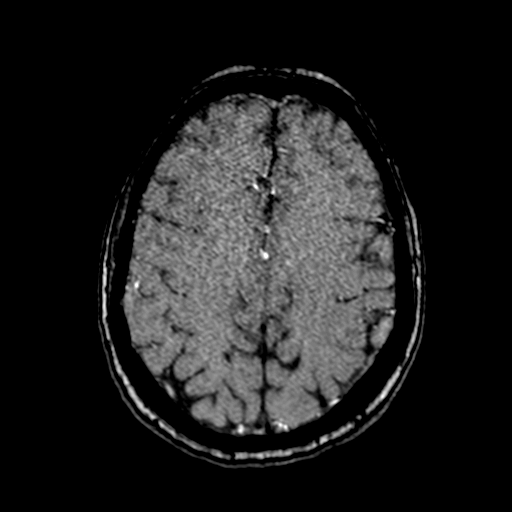

[19 of 48 positions shown; findings below may reference images not displayed]

FINDINGS: MRI HEAD FINDINGS

Brain: More confluent acute/subacute infarction in the left pons. No
hemorrhage, hydrocephalus, or collection. Small remote
microhemorrhage at the right caudate. Remote right inferior pontine
lacunar infarct. Brain volume is normal

Vascular: Major flow voids are preserved.  MRA described below.

Skull and upper cervical spine: Unremarkable marrow signal

Sinuses/Orbits: Bilateral cataract resection

MRA HEAD FINDINGS

Anterior circulation: Mild generalized motion artifact. Atheromatous
irregularity of the cavernous carotids without flow reducing
stenosis. Mild narrowing at the right M1 segment. Probable
atheromatous irregularity of medium size branches. Negative for
aneurysm or vascular malformation.

Posterior circulation: The vertebral and basilar arteries are
smoothly contoured and diffusely patent. Proximal posterior cerebral
arteries are smoothly contoured and diffusely patent. Atheromatous
irregularity of PCA branch vessels. Negative for aneurysm or
vascular malformation.
IMPRESSION: Brain MRI:

1. Progressive size of the acute left pontine infarct when compared
to [DATE].
2. Remote right pontine lacunar infarct.

Intracranial MRA:

1. No emergent finding.  Unremarkable appearance of the basilar.
2. Carotid siphon and branch vessel atherosclerosis.

## 2022-02-06 IMAGING — CT CT HEAD W/O CM
4 series · 15 of 47 positions shown, 17 images · non-contrast
Comparison: [DATE]

CLINICAL DATA: Headaches, neurological deficit, weakness in the
right arm and right lower extremity, fall



[Series 3: head wo · axial · 0.41mm/px · z∈[-138,-24]mm · 7 of 31 slices shown, 9 images]
[im 4/31  brain]
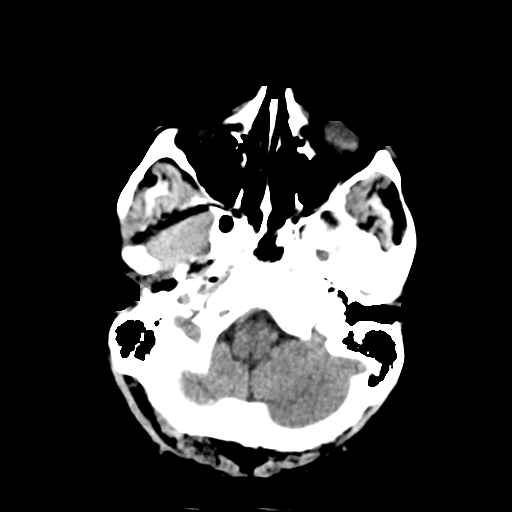
[im 4/31  bone]
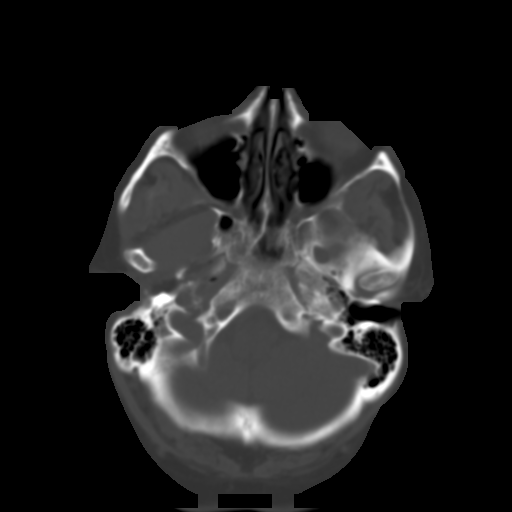
[im 8/31  brain]
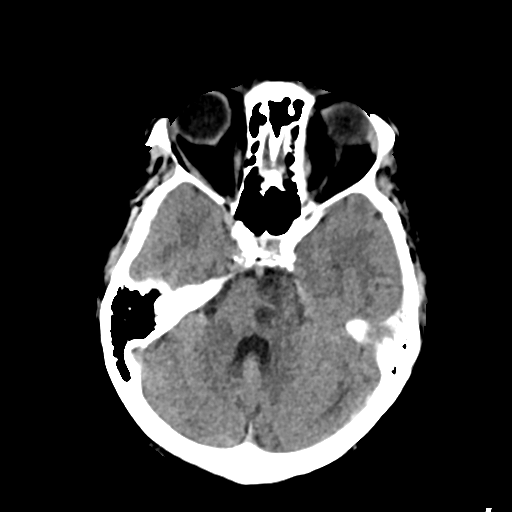
[im 12/31  brain]
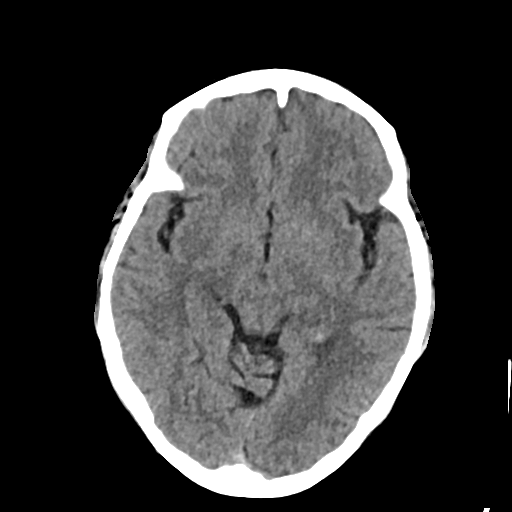
[im 16/31  brain]
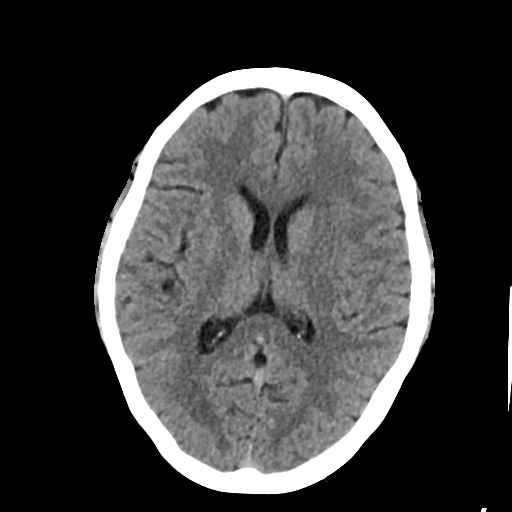
[im 19/31  brain]
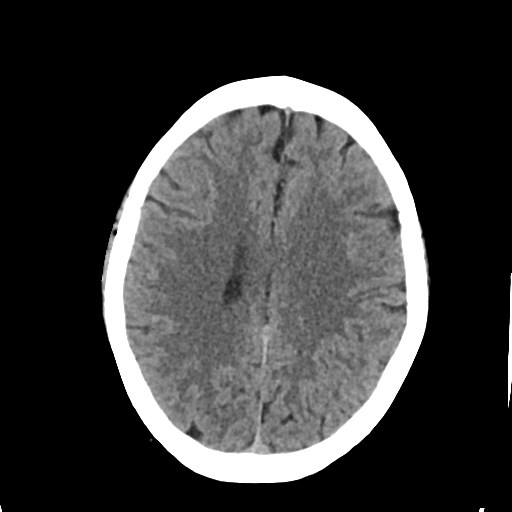
[im 19/31  bone]
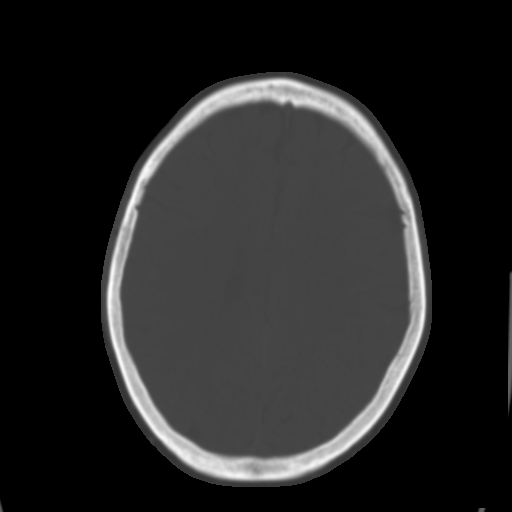
[im 23/31  brain]
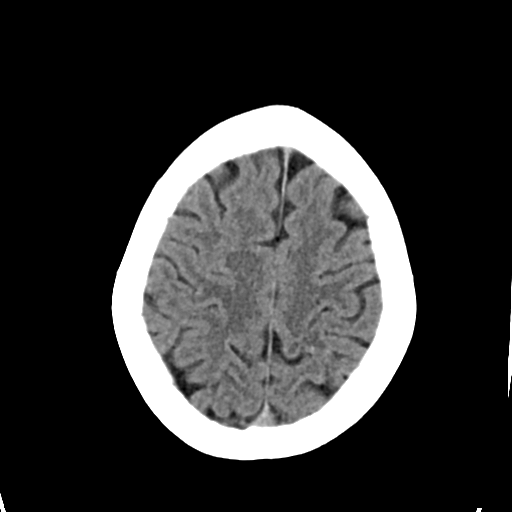
[im 27/31  brain]
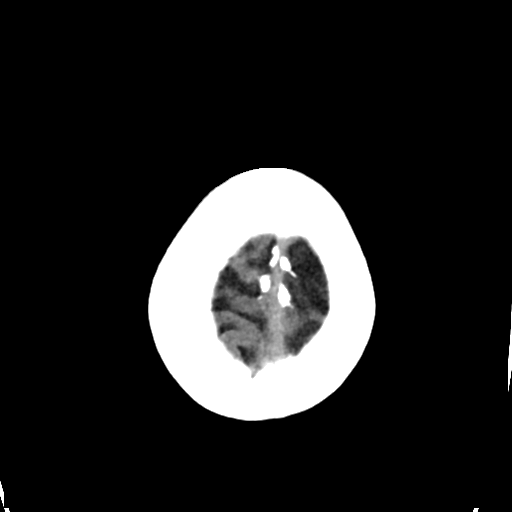

[Series 4: head bone · axial · 0.41mm/px · z∈[-140,-124]mm · 2 of 76 slices shown]
[im 8/76  bone]
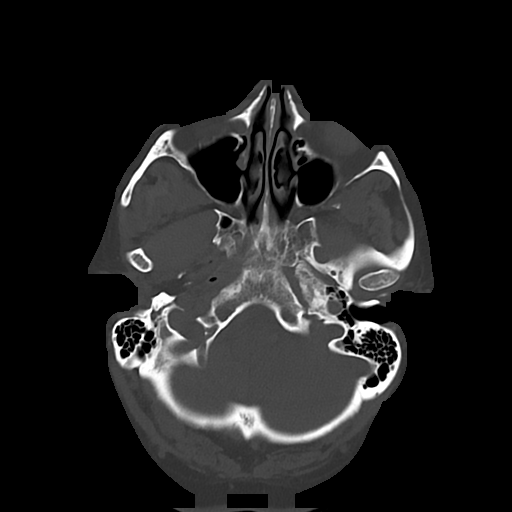
[im 16/76  bone]
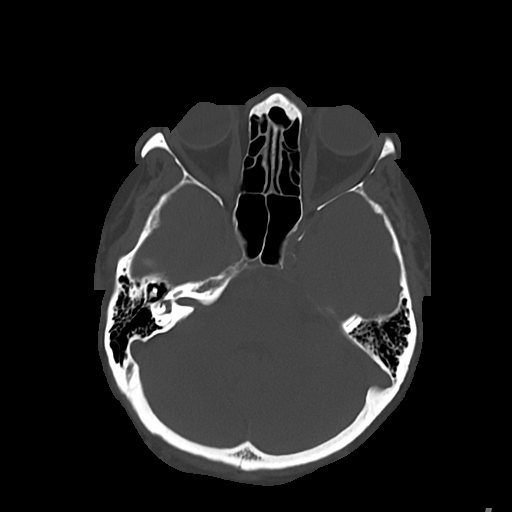

[Series 5: cor soft · coronal · 0.30mm/px · 3 of 66 slices shown]
[im 22/66  brain]
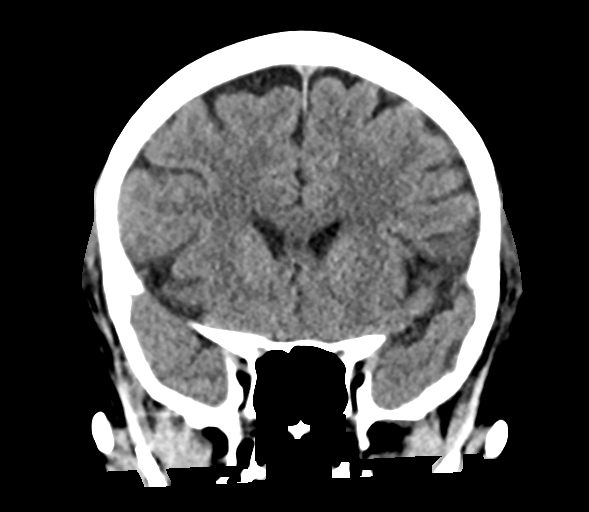
[im 29/66  brain]
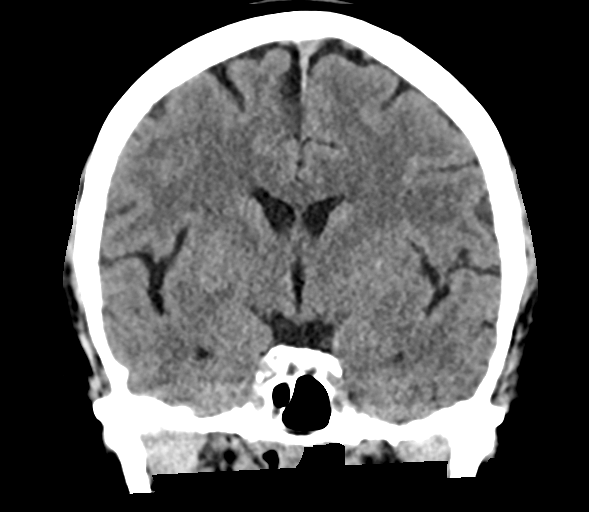
[im 37/66  brain]
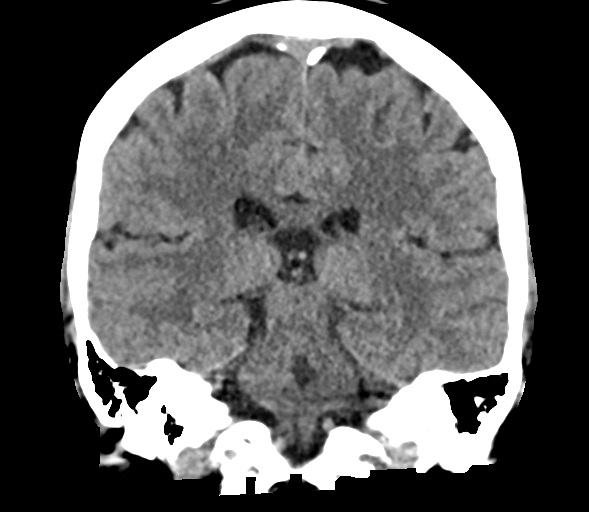

[Series 6: sag soft · sagittal · 0.30mm/px · 3 of 58 slices shown]
[im 20/58  brain]
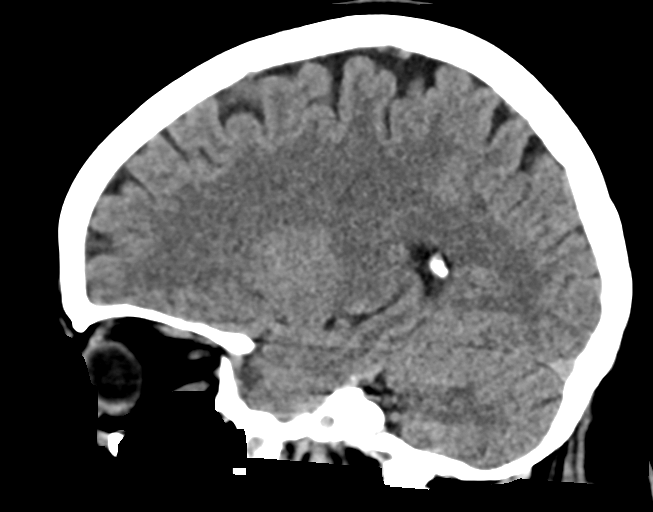
[im 29/58  brain]
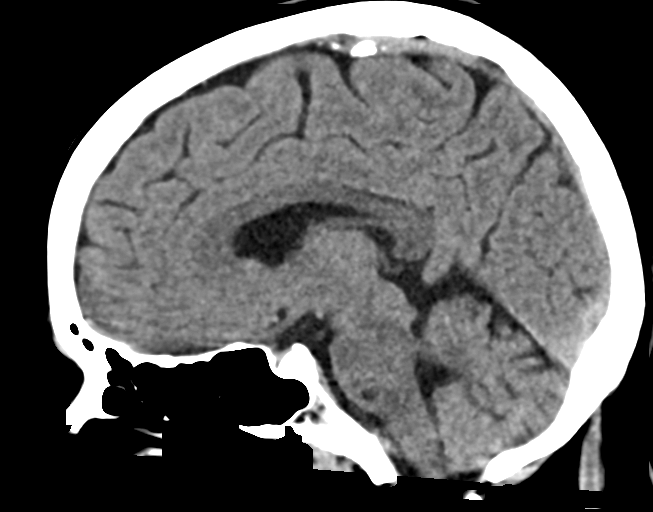
[im 39/58  brain]
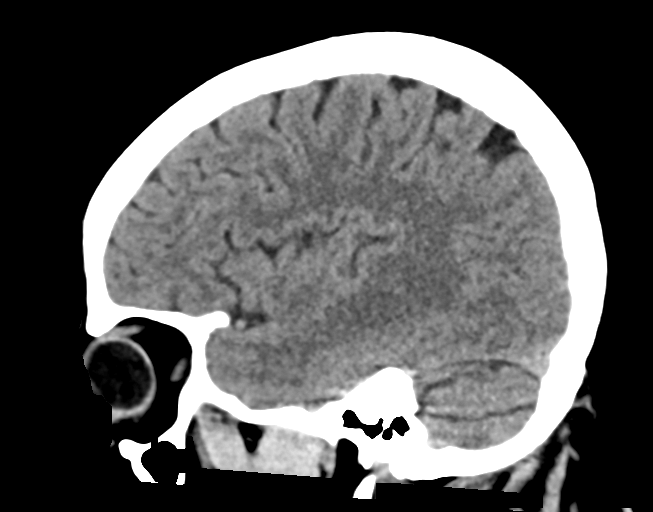

[15 of 47 positions shown; findings below may reference images not displayed]

FINDINGS: Brain: There are no signs of bleeding within the cranium. Ventricles
are not dilated. There is no shift of midline structures. Ventricles
are not dilated. There is 7 mm low-density in the left side of pons,
possibly lacunar infarct seen in the previous MR brain done on
[DATE]. There is another 9 mm low-attenuation in the anterior
aspect of left side of pons. In the previous MRI of brain 2
subcentimeter foci of recent infarcts were noted in the left side of
pons. 9 mm low-attenuation in the anterior aspect of left side of
pons appears larger in comparison with the MR findings.

Vascular: Scattered arterial calcifications are seen.

Skull: Unremarkable.

Sinuses/Orbits: Unremarkable.

Other: None.
IMPRESSION: There are no signs of bleeding within the cranium. There is no focal
mass effect.

There is 7 mm low-density in the posterior aspect of left side of
pons which corresponds to an infarct noted in the previous MR brain
done on [DATE]. There is 9 mm low-density in the anterior aspect
of left side of pons suggesting another focus of infarct which
appears larger in comparison with the infarct seen in the MRI,
possibly suggesting increase in size of anterior left pontine
infarct.

## 2022-02-06 IMAGING — MR MR HEAD W/O CM
12 of 13 series · 44 of 48 positions shown · non-contrast
Comparison: Head CT from earlier today.  Brain MRI [DATE]

CLINICAL DATA: Stroke follow-up

EXAM:
MRI HEAD WITHOUT CONTRAST
MRA HEAD WITHOUT CONTRAST
TECHNIQUE: Multiplanar, multi-echo pulse sequences of the brain and surrounding
structures were acquired without intravenous contrast. Angiographic
images of the Circle of Willis were acquired using MRA technique
without intravenous contrast.

[Series 5: DWI · axial · 3.0mm · 0.88mm/px · z∈[-77,+70]mm · 8 of 100 slices shown (1 of 4)]
[im 1/100]
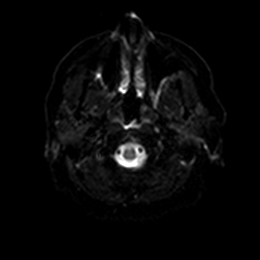
[im 15/100]
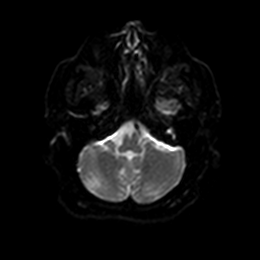
[im 29/100]
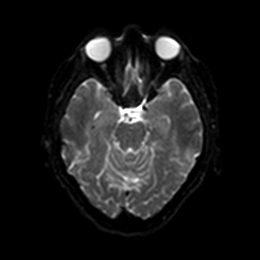
[im 43/100]
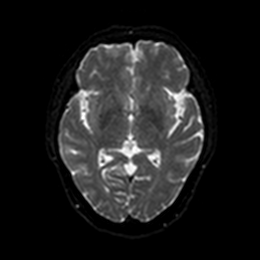
[im 57/100]
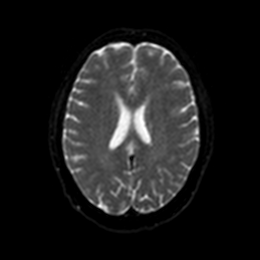
[im 71/100]
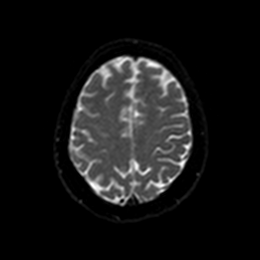
[im 85/100]
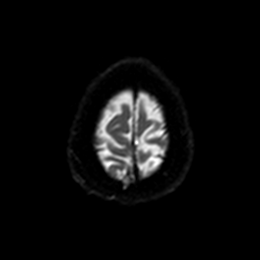
[im 100/100]
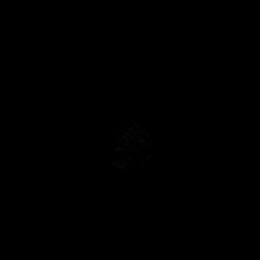

[Series 6: DWI · axial · 3.0mm · 0.88mm/px · z∈[-77,+70]mm · 4 of 50 slices shown (2 of 4)]
[im 1/50]
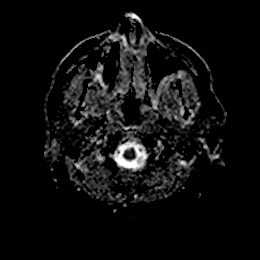
[im 17/50]
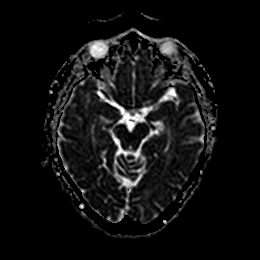
[im 33/50]
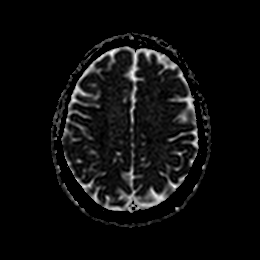
[im 50/50]
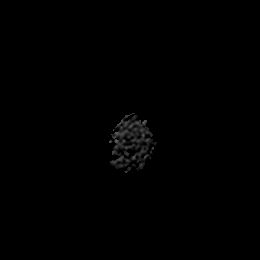

[Series 7: DWI · coronal · 4.0mm · 0.88mm/px · 5 of 70 slices shown (3 of 4)]
[im 1/70]
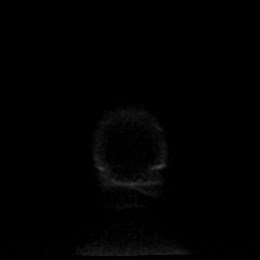
[im 18/70]
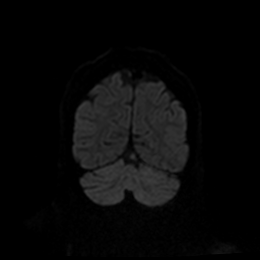
[im 35/70]
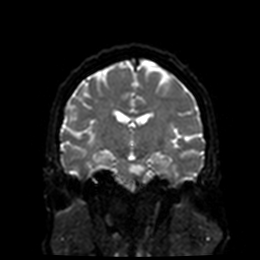
[im 52/70]
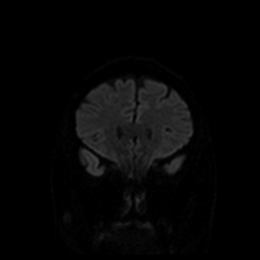
[im 70/70]
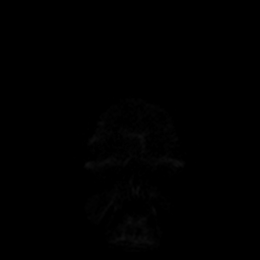

[Series 8: DWI · coronal · 4.0mm · 0.88mm/px · 3 of 35 slices shown (4 of 4)]
[im 1/35]
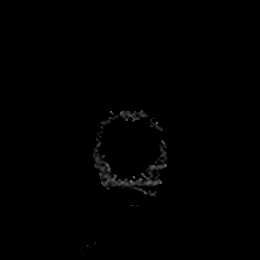
[im 18/35]
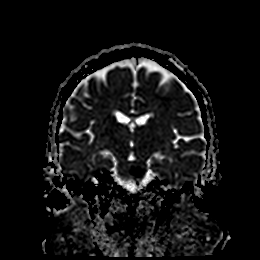
[im 35/35]
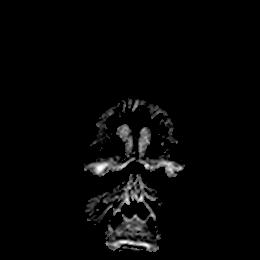

[Series 9: T1 · sagittal · 5.0mm · 0.75mm/px · 2 of 25 slices shown]
[im 1/25]
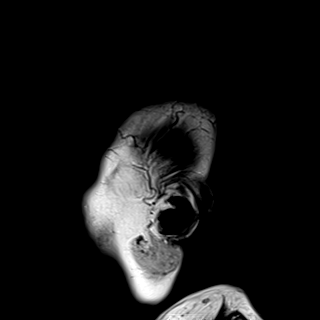
[im 25/25]
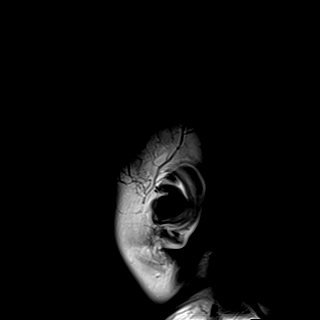

[Series 10: T2 · axial · 5.0mm · 0.72mm/px · z∈[-86,+70]mm · 2 of 27 slices shown (1 of 2)]
[im 1/27]
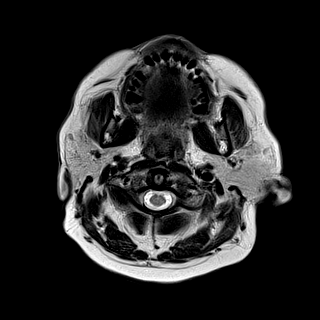
[im 27/27]
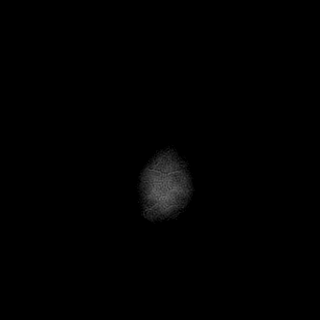

[Series 11: FLAIR · axial · 5.0mm · 0.45mm/px · z∈[-87,+68]mm · 2 of 27 slices shown]
[im 1/27]
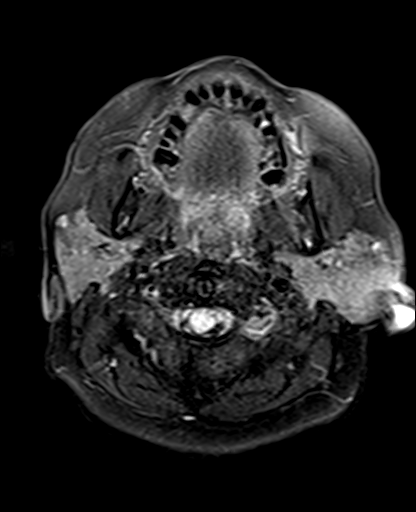
[im 27/27]
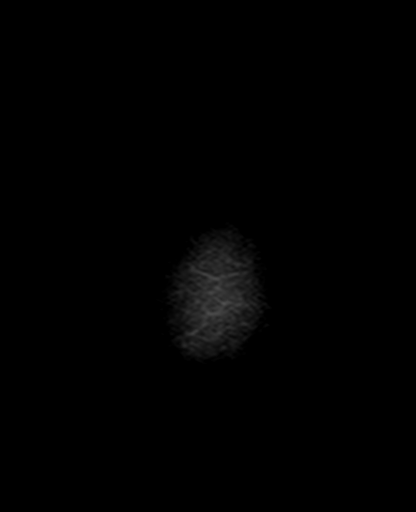

[Series 12: mag_images · axial · 3.0mm · 0.90mm/px · z∈[-92,+72]mm · 4 of 56 slices shown]
[im 1/56]
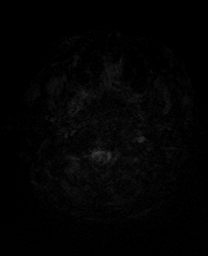
[im 19/56]
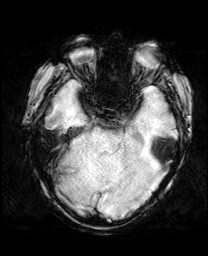
[im 37/56]
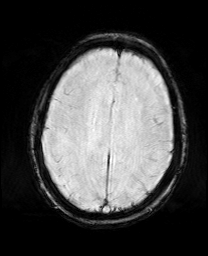
[im 56/56]
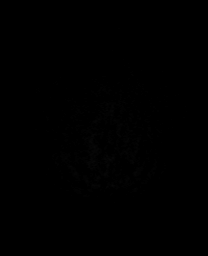

[Series 13: pha_images · axial · 3.0mm · 0.90mm/px · z∈[-92,+66]mm · 4 of 54 slices shown]
[im 1/54]
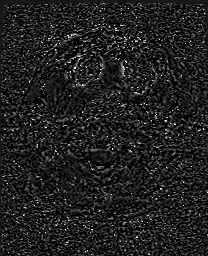
[im 18/54]
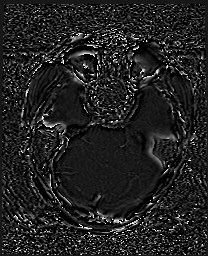
[im 36/54]
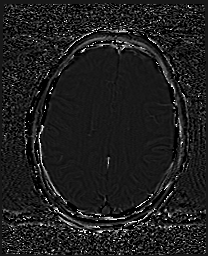
[im 54/54]
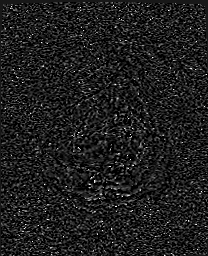

[Series 14: swi_images · axial · 3.0mm · 0.90mm/px · z∈[-92,+72]mm · 4 of 56 slices shown]
[im 1/56]
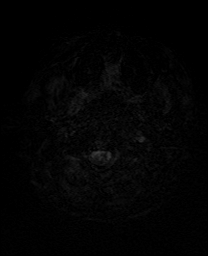
[im 19/56]
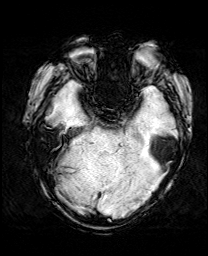
[im 37/56]
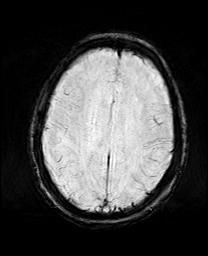
[im 56/56]
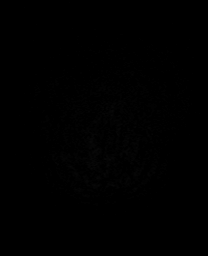

[Series 15: mip_images(sw) · axial · 24.0mm · 0.90mm/px · z∈[-82,+62]mm · 4 of 49 slices shown]
[im 1/49]
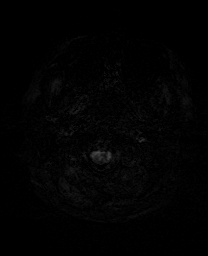
[im 17/49]
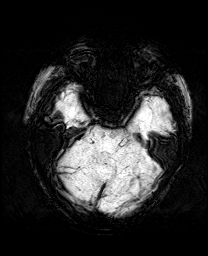
[im 33/49]
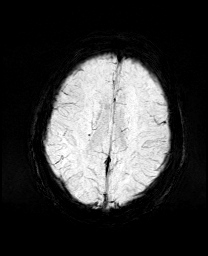
[im 49/49]
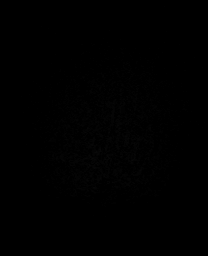

[Series 17: T2 · coronal · 5.0mm · 0.34mm/px · 2 of 31 slices shown (2 of 2)]
[im 1/31]
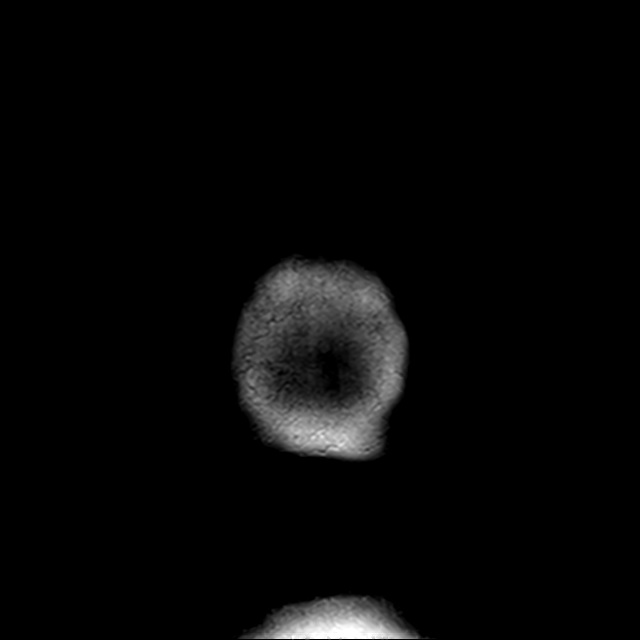
[im 31/31]
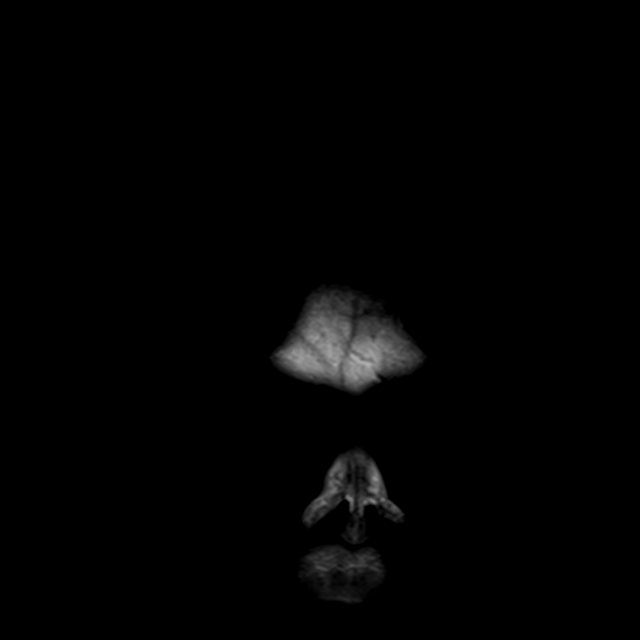

[44 of 48 positions shown; findings below may reference images not displayed]

FINDINGS: MRI HEAD FINDINGS

Brain: More confluent acute/subacute infarction in the left pons. No
hemorrhage, hydrocephalus, or collection. Small remote
microhemorrhage at the right caudate. Remote right inferior pontine
lacunar infarct. Brain volume is normal

Vascular: Major flow voids are preserved.  MRA described below.

Skull and upper cervical spine: Unremarkable marrow signal

Sinuses/Orbits: Bilateral cataract resection

MRA HEAD FINDINGS

Anterior circulation: Mild generalized motion artifact. Atheromatous
irregularity of the cavernous carotids without flow reducing
stenosis. Mild narrowing at the right M1 segment. Probable
atheromatous irregularity of medium size branches. Negative for
aneurysm or vascular malformation.

Posterior circulation: The vertebral and basilar arteries are
smoothly contoured and diffusely patent. Proximal posterior cerebral
arteries are smoothly contoured and diffusely patent. Atheromatous
irregularity of PCA branch vessels. Negative for aneurysm or
vascular malformation.
IMPRESSION: Brain MRI:

1. Progressive size of the acute left pontine infarct when compared
to [DATE].
2. Remote right pontine lacunar infarct.

Intracranial MRA:

1. No emergent finding.  Unremarkable appearance of the basilar.
2. Carotid siphon and branch vessel atherosclerosis.

## 2022-02-06 MED ORDER — SODIUM CHLORIDE 0.9 % IV BOLUS
250.0000 mL | Freq: Once | INTRAVENOUS | Status: AC
  Administered 2022-02-07: 250 mL via INTRAVENOUS

## 2022-02-06 MED ORDER — LORAZEPAM 2 MG/ML IJ SOLN: 0.5000 mg | INTRAMUSCULAR | Status: DC | PRN

## 2022-02-06 MED ORDER — ACETAMINOPHEN 325 MG PO TABS
650.0000 mg | ORAL_TABLET | Freq: Once | ORAL | Status: AC
Start: 2022-02-06 — End: 2022-02-06
  Administered 2022-02-06: 650 mg via ORAL
  Filled 2022-02-06: qty 2

## 2022-02-06 MED ORDER — LORAZEPAM 2 MG/ML IJ SOLN
1.0000 mg | INTRAMUSCULAR | Status: AC | PRN
  Administered 2022-02-06: 1 mg via INTRAVENOUS
  Filled 2022-02-06: qty 1

## 2022-02-06 MED ORDER — LORAZEPAM 1 MG PO TABS: 0.5000 mg | ORAL_TABLET | ORAL | Status: DC | PRN

## 2022-02-06 MED ORDER — SODIUM CHLORIDE 0.9 % IV SOLN: INTRAVENOUS | Status: AC

## 2022-02-06 NOTE — ED Provider Notes (Addendum)
?[facility] EMERGENCY DEPARTMENT ?Provider Note ? ? ?CSN: [id number] ?Arrival date & time: 02/06/22  1152 ? ?  ? ?History ? ?Chief Complaint  ?Patient presents with  ? Stroke sx  ? ? ?Savannah Conner is a 56 y.o. female. ? ?HPI ? ?  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  The patient presents to the emergency department today for right upper extremity weakness.  She states that she had previously had no residual neurologic deficits on discharge from the hospital.  She was discharged on DAPT for 3 weeks followed by Plavix monotherapy.  She presents via EMS from home with a last known normal of around 0 100 this morning when she woke up with a headache.  She went back to sleep and woke up again at 9 and noticed right arm weakness and right leg weakness that caused her to fall.  She did not strike her head or lose consciousness.  She has been compliant with her aspirin and Plavix.  She denies any facial asymmetry.  She denies any dysarthria or dysphagia or aphasia. ? ?Home Medications ?Prior to Admission medications   ?Medication Sig Start Date End Date Taking? Authorizing Provider  ?aspirin 81 MG EC tablet Take 1 tablet (81 mg total) by mouth daily for 18 days. Swallow whole. 02/01/22 02/19/22  Ellison Carwin, MD  ?atorvastatin (LIPITOR) 80 MG tablet Take 1 tablet (80 mg total) by mouth daily. 02/01/22 03/03/22  Ellison Carwin, MD  ?clopidogrel (PLAVIX) 75 MG tablet Take 1 tablet (75 mg total) by mouth daily. 02/01/22   Ellison Carwin, MD  ?insulin glargine (LANTUS) 100 UNIT/ML Solostar Pen Inject 20 Units into the skin at bedtime. 01/31/22   Ellison Carwin, MD  ?Insulin Pen Needle 32G X 4 MM MISC Use to inject  insulin up to 4 times daily as directed. 01/31/22   Ellison Carwin, MD  ?   ? ?Allergies    ?Codeine   ? ?Review of Systems   ?Review of Systems  ?Neurological:  Positive for weakness.  ? ?Physical Exam ?Updated Vital Signs ?BP (!) 156/77   Pulse 79   Temp 98.2 ?F (36.8 ?C) (Oral)   Resp 16   Ht 5\' 2"  (1.575 m)   Wt 73.5 kg   SpO2 100%   BMI 29.64 kg/m?  ?Physical Exam ?Vitals and nursing note reviewed.  ?Constitutional:   ?   General: She is not in acute distress. ?   Appearance: She is well-developed.  ?HENT:  ?   Head: Normocephalic and atraumatic.  ?Eyes:  ?   Conjunctiva/sclera: Conjunctivae normal.  ?Cardiovascular:  ?   Rate and Rhythm: Normal rate and regular rhythm.  ?   Heart sounds: No murmur heard. ?Pulmonary:  ?   Effort: Pulmonary effort is normal. No respiratory distress.  ?   Breath sounds: Normal breath sounds.  ?Abdominal:  ?   Palpations: Abdomen is soft.  ?   Tenderness: There is no abdominal tenderness.  ?Musculoskeletal:     ?   General: No swelling.  ?   Cervical back: Neck supple.  ?Skin: ?   General: Skin is warm and dry.  ?   Capillary Refill: Capillary refill takes less than 2 seconds.  ?Neurological:  ?  Mental Status: She is alert.  ?   Comments: MENTAL STATUS EXAM:    ?Orientation: Alert and oriented to person, place and time.  ?Memory: Cooperative, follows commands well.  ?Language: Speech is clear and language is normal.  ? ?CRANIAL NERVES:    ?CN 2 (Optic): Visual fields intact to confrontation.  ?CN 3,4,6 (EOM): Pupils equal and reactive to light. Full extraocular eye movement without nystagmus.  ?CN 5 (Trigeminal): Facial sensation is normal, no weakness of masticatory muscles.  ?CN 7 (Facial): No facial weakness or asymmetry.  ?CN 8 (Auditory): Auditory acuity grossly normal.  ?CN 9,10 (Glossophar): The uvula is midline, the palate elevates symmetrically.  ?CN 11 (spinal access): Normal sternocleidomastoid and trapezius strength.  ?CN 12 (Hypoglossal): The tongue is midline.  No atrophy or fasciculations..  ? ?MOTOR:  Muscle Strength: 4/5RUE, 5/5LUE, 4/5RLE, 5/5LLE.  ? ?COORDINATION:   Intact finger-to-nose, no tremor, no pronator drift.  ? ?SENSATION:   Intact to light touch all four extremities. ? ?  ?Psychiatric:     ?   Mood and Affect: Mood normal.  ? ? ?ED Results / Procedures / Treatments   ?Labs ?(all labs ordered are listed, but only abnormal results are displayed) ?Labs Reviewed  ?APTT - Abnormal; Notable for the following components:  ?    Result Value  ? aPTT 23 (*)   ? All other components within normal limits  ?CBC - Abnormal; Notable for the following components:  ? RDW 11.4 (*)   ? All other components within normal limits  ?COMPREHENSIVE METABOLIC PANEL - Abnormal; Notable for the following components:  ? Glucose, Bld 256 (*)   ? All other components within normal limits  ?URINALYSIS, ROUTINE W REFLEX MICROSCOPIC - Abnormal; Notable for the following components:  ? APPearance CLOUDY (*)   ? Glucose, UA >=500 (*)   ? Ketones, ur 5 (*)   ? Protein, ur 30 (*)   ? Leukocytes,Ua TRACE (*)   ? Bacteria, UA FEW (*)   ? All other components within normal limits  ?I-STAT CHEM 8, ED - Abnormal; Notable for the following components:  ? Glucose, Bld 263 (*)   ? Calcium, Ion 1.10 (*)   ? All other components within normal limits  ?RESP PANEL BY RT-PCR (FLU A&B, COVID) [id number]  ?ETHANOL  ?PROTIME-INR  ?DIFFERENTIAL  ?RAPID URINE DRUG SCREEN, HOSP PERFORMED  ?I-STAT BETA HCG BLOOD, ED ([facility], [facility], [facility] ONLY)  ? ? ?EKG ?EKG Interpretation ? ?Date/Time:  Tuesday February 06 2022 12:22:36 EDT ?Ventricular Rate:  76 ?PR Interval:  160 ?QRS Duration: 86 ?QT Interval:  425 ?QTC Calculation: 478 ?R Axis:   66 ?Text Interpretation: Sinus rhythm Confirmed by Ernie Avena ([id number]) on 02/06/2022 12:27:42 PM ? ?Radiology ?CT HEAD WO CONTRAST ? ?Result Date: 02/06/2022 ?CLINICAL DATA:  Headaches, neurological deficit, weakness in the right arm and right lower extremity, fall EXAM: CT HEAD WITHOUT CONTRAST  TECHNIQUE: Contiguous axial images were obtained from the base of the skull through the vertex without intravenous contrast. RADIATION DOSE REDUCTION: This exam was performed according to the departmental dose-optimization program which includes automated exposure control, adjustment of the mA and/or kV according to patient size and/or use of iterative reconstruction technique. COMPARISON:  01/29/2022 FINDINGS: Brain: There are no signs of bleeding within the cranium. Ventricles are not dilated. There is no shift of midline structures. Ventricles are not dilated. There is 7 mm low-density in the left side of pons, possibly lacunar infarct seen in the previous MR  brain done on 01/29/2022. There is another 9 mm low-attenuation in the anterior aspect of left side of pons. In the previous MRI of brain 2 subcentimeter foci of recent infarcts were noted in the left side of pons. 9 mm low-attenuation in the anterior aspect of left side of pons appears larger in comparison with the MR findings. Vascular: Scattered arterial calcifications are seen. Skull: Unremarkable. Sinuses/Orbits: Unremarkable. Other: None. IMPRESSION: There are no signs of bleeding within the cranium. There is no focal mass effect. There is 7 mm low-density in the posterior aspect of left side of pons which corresponds to an infarct noted in the previous MR brain done on 01/29/2022. There is 9 mm low-density in the anterior aspect of left side of pons suggesting another focus of infarct which appears larger in comparison with the infarct seen in the MRI, possibly suggesting increase in size of anterior left pontine infarct. Electronically Signed   By: Ernie AvenaPalani  Rathinasamy M.D.   On: 02/06/2022 12:48   ? ?Procedures ?Procedures  ? ? ?Medications Ordered in ED ?Medications  ?LORazepam (ATIVAN) tablet 0.5 mg (has no administration in time range)  ?LORazepam (ATIVAN) injection 0.5 mg (has no administration in time range)  ?LORazepam (ATIVAN) injection 1 mg  (1 mg Intravenous Given 02/06/22 1903)  ?acetaminophen (TYLENOL) tablet 650 mg (650 mg Oral Given 02/06/22 1834)  ? ? ?ED Course/ Medical Decision Making/ A&P ?  ?                        ?Medical Decision Making ?[place]

## 2022-02-06 NOTE — Consult Note (Addendum)
NEUROLOGY CONSULTATION NOTE  ? ?Date of service: February 06, 2022 ?Patient Name: Savannah Conner ?MRN:  [id number] ?DOB:  December 22, 1965 ?Reason for consult: "Stroke extension" ?Requesting Provider: Ernie Avena, MD ? ?History of Present Illness  ?Savannah Conner is a 57 y.o. female with PMH uncontrolled DM2, HLD, ischemic left pons stroke, who presented to [facility] (02/06/2022) for bilateral legs and right arm weakness.  ? ?02/06/2022 patient woke up 0100 woke up with headache that started in the left side of her occiput then radiated upward and downward burning and intense in nature that was on and off. She got up to walk around, however headache was still present, then went back to bed at 0300. She denied N/V, palpitations, dizziness, blurry vision, gait instability, weakness at that time.  ?At 0900 her legs collapsed under her after getting out of bed to use the restroom. She was down for about 2-7mins, then pulled herself into her bed then had bladder and bowel incontinence. About later, she was able to get up and walk to her bathroom to clean herself then called EMS. She reported bilateral leg numbness and having the urge to urinate and defecate prior to falling.  She denied loss of consciousness, tingling in her legs, N/V, palpitations, dizziness, blurry vision, confusion. ? ?She reported taking her Plavix and aspirin daily as directed, as well as her statin and insulin.  CBG at home runs about 260s. She quit smoking last year after about 10 years.  Reported drinking rarely, last drink in November last year. ? ?She reported excessive daytime somnolence, difficulty sleeping, waking up feeling like she cannot breathe, frequently falling asleep while sitting up, decreased energy.  Denied ever having a sleep study, but is amenable for OSA work-up.  ? ?She reported intermittent and transient palpitations in the past, but that has occurred during rest. ? ?LKN: 0100 ?tNK: outside of window ? ?ROS  ? ?Constitutional  Denies fever and chills.  Intentional weight loss  ?HEENT Denies changes in vision and hearing.   ?Respiratory Denies SOB and cough.   ?CV Palpitations and no CP   ?GI Denies abdominal pain, nausea, vomiting and diarrhea.   ?GU Denies dysuria and urinary frequency.   ?Skin Denies rash and pruritus.   ?Neurological Headache and denied syncope.   ?Psychiatric Reported anxiety, but able to control. Denied Depressive mood.  ? ?Past History  ? ?Past Medical History:  ?Diagnosis Date  ? Diabetes mellitus without complication (HCC)   ? Stroke [facility])   ? ?History reviewed. No pertinent surgical history. ?No family history on file. ?Social History  ? ?Socioeconomic History  ? Marital status: Widowed  ?  Spouse name: Not on file  ? Number of children: Not on file  ? Years of education: Not on file  ? Highest education level: Not on file  ?Occupational History  ? Not on file  ?Tobacco Use  ? Smoking status: Never  ? Smokeless tobacco: Never  ?Substance and Sexual Activity  ? Alcohol use: Never  ? Drug use: Never  ? Sexual activity: Not on file  ?Other Topics Concern  ? Not on file  ?Social History Narrative  ? Not on file  ? ?Social Determinants of Health  ? ?Financial Resource Strain: Not on file  ?Food Insecurity: Not on file  ?Transportation Needs: Not on file  ?Physical Activity: Not on file  ?Stress: Not on file  ?Social Connections: Not on file  ? ?Allergies  ?Allergen Reactions  ? Codeine Other (See Comments)  ?  Burns her stomach  ? ? ?Medications  ?(Not in a hospital admission) ?  ? ?Vitals  ? ?Vitals:  ? 02/06/22 1345 02/06/22 1400 02/06/22 1415 02/06/22 1430  ?BP: 140/65 (!) 143/63 (!) 144/82 (!) 143/67  ?Pulse: 74 73 85 73  ?Resp: 17 10 13 14   ?Temp:      ?TempSrc:      ?SpO2: 99% 99% 97% 96%  ?Weight:      ?Height:      ?  ? ?Body mass index is 29.64 kg/m?. ? ?Physical Exam  ?General: Laying comfortably in bed; in no acute distress.  ?Pulmonary: Symmetric chest rise. Non-labored respiratory effort.  ?Ext: No  cyanosis, edema, or deformity  ?Musculoskeletal: Normal digits and nails by inspection. No clubbing.  ? ?Neurologic Examination  ?Mental status/Cognition:  ?Alert, attentive ?Oriented to self, place, month and year ?Good attention-can report days of week backwards ?Speech/language:  ?Fluent, thought content appropriate.  ?Comprehension intact-able to follow 3 step commands without difficulty. Object naming intact, repetition intact.  No neglect. ? ?Cranial Nerves: ?II: No VF deficits.  ?III,IV, VI: No gaze preference or deviation. Ptosis not present, extra-ocular motions intact bilaterally. No nystagmus. OD did not constrict to light, OS pupillary reflex intact ?V: Facial light touch sensation normal bilaterally ?VII: Smile symmetric, no nasolabial fold flattening  ?VIII: Hearing normal bilaterally ?IX,X: Cough intact.  ?XI: Bilateral shoulder shrug and head turn ?XII: Midline tongue extension ?Motor: ?R  UE 4+/5 with pronator drift LE 4+/5 without drift, hoover sign +  ?L UE 5/5 LE 5/5  ?Normal tone throughout. Normal bulk throughout. No atrophy noted ? ?Sensory: Pinprick and light touch intact throughout bilaterally ? ?Reflexes: ? Right Left Comments  ? Biceps (C5/6) 2 2   ?Brachioradialis (C5/6) 1 1   ? Patellar (L3/4) 2 2   ? Achilles (S1) diminished diminished   ? ? ?Coordination/Complex Motor:  ?- Finger to Nose intact ?- Rapid alternating movement slower in right than left ?- Heel to shin left leg ataxia, none in right.  ?- Gait: deferred ? ?Labs  ? ?CBC:  ?Recent Labs  ?Lab 02/06/22 ?1208 02/06/22 ?1224  ?WBC 10.0  --   ?NEUTROABS 7.2  --   ?HGB 13.4 13.3  ?HCT 40.2 39.0  ?MCV 91.0  --   ?PLT 271  --   ? ? ?Basic Metabolic Panel:  ?Lab Results  ?Component Value Date  ? NA 139 02/06/2022  ? K 3.9 02/06/2022  ? CO2 24 02/06/2022  ? GLUCOSE 263 (H) 02/06/2022  ? BUN 13 02/06/2022  ? CREATININE 0.50 02/06/2022  ? CALCIUM 9.0 02/06/2022  ? GFRNONAA >60 02/06/2022  ? ?Lipid Panel:  ?Lab Results  ?Component Value  Date  ? LDLCALC UNABLE TO CALCULATE IF TRIGLYCERIDE OVER 400 mg/dL 02/08/2022  ? ?HgbA1c:  ?Lab Results  ?Component Value Date  ? HGBA1C 12.4 (H) 01/30/2022  ? ?Urine Drug Screen:  ?   ?Component Value Date/Time  ? LABOPIA NONE DETECTED 01/29/2022 1430  ? COCAINSCRNUR NONE DETECTED 01/29/2022 1430  ? LABBENZ NONE DETECTED 01/29/2022 1430  ? AMPHETMU NONE DETECTED 01/29/2022 1430  ? THCU NONE DETECTED 01/29/2022 1430  ? LABBARB NONE DETECTED 01/29/2022 1430  ?  ?Alcohol Level  ?   ?Component Value Date/Time  ? ETH <10 02/06/2022 1208  ? ?CT Head without contrast: ?There are no signs of bleeding within the cranium. There is no focal mass effect. There is 7 mm low-density in the posterior aspect of left side of pons  which corresponds to an infarct noted in the previous MR brain done on 01/29/2022. There is 9 mm low-density in the anterior aspect of left side of pons suggesting another focus of infarct which appears larger in comparison with the infarct seen in the MRI, possibly suggesting increase in size of anterior left pontine infarct. Electronically Signed   By: Ernie AvenaPalani  Rathinasamy M.D.   On: 02/06/2022 12:48 ? ?MRI Brain: ?Ordered  ? ?Impression  ?Savannah Conner is a 56 y.o. female with PMH uncontrolled DM2, HLD, ischemic left pons stroke, who presented to [facility] (02/06/2022) for bilateral legs and right arm weakness then admitted for stroke extension work-up. ? ?Rule out previous stroke extension with MRI given patient's stroke risk factors and CTH findings per above.  ? ?Occipital neuralgia with lingering burning to touch on the left side of her neck that has much improved.  ? ?Bowel and bladder incontinence due to feeling overwhelmed after falling rather than neurologic etiology as she reported feeling the urge before getting up and urge during initial evaluation.  ? ?Likely OSA/OHS given disrupted sleep, daytime somnolence, waking up gasping for air, falling asleep while sitting up. ? ?Recommendations   ?- No HgbA1c, fasting lipid panel, Echocardiogram as patient received these with full stroke workup last week. ?- MRI brain ?- Frequent neuro checks ?- Continue DAPT ?- Continue Statin ?- Risk factor modificatio

## 2022-02-06 NOTE — ED Notes (Signed)
Pt ambulated to the bathroom.  

## 2022-02-06 NOTE — ED Triage Notes (Signed)
Patient BIB [facility] from home, complaint of stroke like sx. Patient work up 0100 this morning with headache but was fine otherwise. Woke up again 0900, pt still had headache, also noticed right arm, leg weakness that caused her to fall. Pt was dx with stroke 4/17 and discharged recently with no residual deficits. On plavix and aspirin following stroke.  ?

## 2022-02-06 NOTE — ED Provider Notes (Signed)
Care of patient assumed from Dr. Hart Rochester at 3:30 PM.  This patient presents with new right-sided weakness.  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  Neurology has been involved today.  There is concern of worsened stroke.  She is currently awaiting MRI studies.  Following results of MRI, reach out to neurology for further discussion. ?Physical Exam  ?BP (!) 151/69   Pulse 75   Temp 98.2 ?F (36.8 ?C) (Oral)   Resp 16   Ht 5\' 2"  (1.575 m)   Wt 73.5 kg   SpO2 99%   BMI 29.64 kg/m?  ? ?Physical Exam ?Vitals and nursing note reviewed.  ?Constitutional:   ?   General: She is not in acute distress. ?   Appearance: Normal appearance. She is well-developed. She is not ill-appearing, toxic-appearing or diaphoretic.  ?HENT:  ?   Head: Normocephalic and atraumatic.  ?   Right Ear: External ear normal.  ?   Left Ear: External ear normal.  ?   Nose: Nose normal.  ?Eyes:  ?   Extraocular Movements: Extraocular movements intact.  ?   Conjunctiva/sclera: Conjunctivae normal.  ?Cardiovascular:  ?   Rate and Rhythm: Normal rate and regular rhythm.  ?   Heart sounds: No murmur heard. ?Pulmonary:  ?   Effort: Pulmonary effort is normal. No respiratory distress.  ?Abdominal:  ?   General: There is no distension.  ?   Palpations: Abdomen is soft.  ?Musculoskeletal:     ?   General: No swelling. Normal range of motion.  ?   Cervical back: Normal range of motion and neck supple.  ?Skin: ?   General: Skin is warm and dry.  ?   Capillary Refill: Capillary refill takes less than 2 seconds.  ?   Coloration: Skin is not jaundiced or pale.  ?Neurological:  ?   Mental Status: She is alert and oriented to person, place, and time.  ?   Cranial Nerves: No cranial nerve deficit.  ?   Sensory: No sensory deficit.  ?   Motor: Weakness (Slightly diminished strength in right upper extremity) present.  ?Psychiatric:     ?   Mood and Affect: Mood normal.     ?   Behavior: Behavior normal.     ?   Thought Content: Thought content  normal.     ?   Judgment: Judgment normal.  ? ? ?Procedures  ?Procedures ? ?ED Course / MDM  ?  ?Medical Decision Making ?Amount and/or Complexity of Data Reviewed ?Labs: ordered. ?Radiology: ordered. ? ?Risk ?OTC drugs. ?Prescription drug management. ?Decision regarding hospitalization. ? ? ?On assessment, patient resting comfortably.  Tylenol was provided for headache.  MRI showed progressive size of her previous stroke 1 week ago.  I spoke with neurology who evaluated her at bedside.  They do feel like she would benefit from admission for permissive hypertension and PT evaluation. She is to continue her aspirin and Plavix. Care of patient was signed out to oncoming ED provider.   ? ? ? ? ?  ? , MD ?02/07/22 0017 ? ?

## 2022-02-06 NOTE — ED Notes (Signed)
Patient transported to MRI 

## 2022-02-07 ENCOUNTER — Encounter (HOSPITAL_COMMUNITY): Payer: Self-pay | Admitting: Infectious Diseases

## 2022-02-07 DIAGNOSIS — I639 Cerebral infarction, unspecified: Secondary | ICD-10-CM

## 2022-02-07 LAB — CBC
HCT: 37.6 % (ref 36.0–46.0)
Hemoglobin: 12.9 g/dL (ref 12.0–15.0)
MCH: 30.9 pg (ref 26.0–34.0)
MCHC: 34.3 g/dL (ref 30.0–36.0)
MCV: 90 fL (ref 80.0–100.0)
Platelets: 266 10*3/uL (ref 150–400)
RBC: 4.18 MIL/uL (ref 3.87–5.11)
RDW: 11.3 % — ABNORMAL LOW (ref 11.5–15.5)
WBC: 8.6 10*3/uL (ref 4.0–10.5)
nRBC: 0 % (ref 0.0–0.2)

## 2022-02-07 LAB — RESP PANEL BY RT-PCR (FLU A&B, COVID) ARPGX2
Influenza A by PCR: NEGATIVE
Influenza B by PCR: NEGATIVE
SARS Coronavirus 2 by RT PCR: NEGATIVE

## 2022-02-07 LAB — CREATININE, SERUM
Creatinine, Ser: 0.55 mg/dL (ref 0.44–1.00)
GFR, Estimated: >60 mL/min (ref >60)

## 2022-02-07 LAB — CBG MONITORING, ED
Glucose-Capillary: 149 mg/dL — ABNORMAL HIGH (ref 70–99)
Glucose-Capillary: 148 mg/dL — ABNORMAL HIGH (ref 70–99)

## 2022-02-07 LAB — GLUCOSE, CAPILLARY
Glucose-Capillary: 178 mg/dL — ABNORMAL HIGH (ref 70–99)
Glucose-Capillary: 244 mg/dL — ABNORMAL HIGH (ref 70–99)

## 2022-02-07 MED ORDER — CLOPIDOGREL BISULFATE 75 MG PO TABS: 75.0000 mg | ORAL_TABLET | Freq: Every day | ORAL | Status: DC

## 2022-02-07 MED ORDER — ACETAMINOPHEN 650 MG RE SUPP: 650.0000 mg | Freq: Four times a day (QID) | RECTAL | Status: DC | PRN

## 2022-02-07 MED ORDER — SENNOSIDES-DOCUSATE SODIUM 8.6-50 MG PO TABS: 1.0000 | ORAL_TABLET | Freq: Every evening | ORAL | Status: DC | PRN

## 2022-02-07 MED ORDER — ONDANSETRON HCL 4 MG PO TABS: 4.0000 mg | ORAL_TABLET | Freq: Four times a day (QID) | ORAL | Status: DC | PRN

## 2022-02-07 MED ORDER — TICAGRELOR 90 MG PO TABS
90.0000 mg | ORAL_TABLET | Freq: Two times a day (BID) | ORAL | Status: DC
  Administered 2022-02-07 – 2022-02-08 (×3): 90 mg via ORAL
  Filled 2022-02-07 (×3): qty 1

## 2022-02-07 MED ORDER — INSULIN ASPART 100 UNIT/ML IJ SOLN
0.0000 [IU] | Freq: Three times a day (TID) | INTRAMUSCULAR | Status: DC
  Administered 2022-02-07 (×2): 2 [IU] via SUBCUTANEOUS
  Administered 2022-02-07 – 2022-02-08 (×3): 3 [IU] via SUBCUTANEOUS

## 2022-02-07 MED ORDER — ASPIRIN EC 81 MG PO TBEC
81.0000 mg | DELAYED_RELEASE_TABLET | Freq: Every day | ORAL | Status: DC
  Administered 2022-02-07 – 2022-02-08 (×2): 81 mg via ORAL
  Filled 2022-02-07 (×2): qty 1

## 2022-02-07 MED ORDER — INSULIN GLARGINE-YFGN 100 UNIT/ML ~~LOC~~ SOLN
20.0000 [IU] | Freq: Every day | SUBCUTANEOUS | Status: DC
  Administered 2022-02-07 (×2): 20 [IU] via SUBCUTANEOUS
  Filled 2022-02-07 (×4): qty 0.2

## 2022-02-07 MED ORDER — ONDANSETRON HCL 4 MG/2ML IJ SOLN
4.0000 mg | Freq: Four times a day (QID) | INTRAMUSCULAR | Status: DC | PRN
Start: 2022-02-07 — End: 2022-02-08

## 2022-02-07 MED ORDER — ACETAMINOPHEN 325 MG PO TABS
650.0000 mg | ORAL_TABLET | Freq: Four times a day (QID) | ORAL | Status: DC | PRN
  Administered 2022-02-08: 650 mg via ORAL
  Filled 2022-02-07: qty 2

## 2022-02-07 MED ORDER — ATORVASTATIN CALCIUM 80 MG PO TABS
80.0000 mg | ORAL_TABLET | Freq: Every day | ORAL | Status: DC
  Administered 2022-02-07 – 2022-02-08 (×2): 80 mg via ORAL
  Filled 2022-02-07: qty 1
  Filled 2022-02-07: qty 2

## 2022-02-07 MED ORDER — ENOXAPARIN SODIUM 40 MG/0.4ML IJ SOSY: 40.0000 mg | PREFILLED_SYRINGE | INTRAMUSCULAR | Status: DC

## 2022-02-07 NOTE — Evaluation (Signed)
Speech Language Pathology Evaluation ?Patient Details ?Name: Savannah Conner ?MRN: [id number] ?DOB: Sep 01, 1966 ?Today's Date: 02/07/2022 ?Time: 1735-1755 ?SLP Time Calculation (min) (ACUTE ONLY): 20 min ? ?Problem List:  ?Patient Active Problem List  ? Diagnosis Date Noted  ? 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 Weakness of right upper extremity   ? Left pontine stroke (HCC) 01/29/2022  ? ?Past Medical History:  ?Past Medical History:  ?Diagnosis Date  ? Diabetes mellitus without complication (HCC)   ? Stroke [facility])   ? ?Past Surgical History: History reviewed. No pertinent surgical history. ?HPI:  ?Pt is a 56 y/o female admitted following fall and RLE and RUE weakness. Per notes, Stroke vs recrudescence of previous infarct in L pontine area. PMH includes HTN, CVA, and DM. She was recently admitted (01/29/22) with CVA but at that time SLP evaluation did not reveal any speech-language or cognitive impairments. ? ?Assessment / Plan / Recommendation ?Clinical Impression ? 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 Her receptive and expressive language abilities were WFL-WNL. 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 She exhibited only mild right sided decreased labial ROM/strength but lingual ROM and strength appeared [facility]. Speech intelligiblity is reduced however does not sound slurred as in a flaccid dysarthria, instead appearing with some discoordination and overall change in prosody. 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 SLP is recommending speech therapy services at the next venue of care with current plan appearing to be for [facility]. If she is not able to go  to [facility], SLP recommends Outpatient SLP. ?   ?SLP Assessment ? SLP Recommendation/Assessment: All further Speech Lanaguage Pathology  needs can be addressed in the next venue of care ?SLP Visit Diagnosis: Cognitive communication deficit (R41.841);Dysarthria and anarthria (R47.1)  ?  ?Recommendations for follow up therapy are one component of a multi-disciplinary discharge planning process, led by the attending physician.  Recommendations may be updated based on patient status, additional functional criteria and insurance authorization. ?   ?Follow Up Recommendations ? Acute inpatient rehab (3hours/day)  ?  ?Assistance Recommended at Discharge ? Intermittent Supervision/Assistance  ?Functional Status Assessment Patient has had a recent decline in their functional status and demonstrates the ability to make significant improvements in function in a reasonable and predictable amount of time.  ?Frequency and Duration    ?  ?  ?   ?SLP Evaluation ?Cognition ? Overall Cognitive Status: Impaired/Different from baseline ?Orientation Level: Oriented X4 ?Year: 2023 ?Month: April ?Day of Week: Correct ?Attention: Sustained ?Sustained Attention: Appears intact ?Memory: Impaired ?Memory Impairment: Retrieval deficit ?Awareness: Impaired ?Awareness Impairment: Anticipatory impairment ?Problem Solving: Impaired ?Problem Solving Impairment: Verbal complex ?Safety/Judgment: Appears intact  ?  ?   ?Comprehension ? Auditory Comprehension ?Overall Auditory Comprehension: Appears within functional limits for tasks assessed  ?  ?Expression Expression ?Primary Mode of Expression: Verbal ?Verbal Expression ?Overall Verbal Expression: Appears within functional limits for tasks assessed ?Written Expression ?Dominant Hand: Right   ?Oral / Motor ? Oral Motor/Sensory Function ?Overall Oral Motor/Sensory Function: Mild impairment ?Facial ROM: Reduced right ?Facial Symmetry: Within Functional Limits ?Facial Strength: Within Functional  Limits ?Facial Sensation: Within Functional Limits ?Lingual ROM: Within Functional Limits ?Lingual Symmetry: Within  Functional Limits ?Lingual Strength: Within Functional Limits ?Motor Speech ?Overall Motor Speech: Impaired ?Respiration: Within functional limits ?Phonation: Normal ?Resonance: Within functional limits ?Articulation: Impaired ?Level of Impairment: Sentence ?Intelligibility: Intelligibility reduced ?Word: 75-100% accurate ?Phrase: 75-100% accurate ?Sentence: 75-100% accurate ?Conversation: 75-100% accurate ?Motor Speech Errors: Aware ?Effective Techniques: Slow rate;Increased vocal intensity   ?        ?Angela Nevin, MA, CCC-SLP ?Speech Therapy ? ? ?

## 2022-02-07 NOTE — Progress Notes (Signed)
? ?Subjective:  ?Overnight Events: No acute events or concerns overnight. ? ?Patient was seen and examined on rounds.  She continues to endorse right-sided weakness with change in speech.  Requesting Tylenol for headache.  Has not yet followed up with [facility] or [place] [facility] for hospital follow-up appointment.  ? ?Objective: ? ?Vital signs in last 24 hours: ?Vitals:  ? 02/07/22 0445 02/07/22 0500 02/07/22 0600 02/07/22 0630  ?BP: (!) 134/56 124/63 (!) 147/63 (!) 157/63  ?Pulse: 67 63 66 73  ?Resp: 16  16 16   ?Temp:      ?TempSrc:      ?SpO2: 99% 98% 100% 97%  ?Weight:      ?Height:      ? ? ?No intake or output data in the 24 hours ending 02/07/22 0848 ? ?Physical Exam: ?General: Well appearing obese Hispanic female, NAD ?HENT: normocephalic, atraumatic, external ears and nares appear normal ?EYES: conjunctiva non-erythematous, no scleral icterus ?CV: regular rate, normal rhythm, no murmurs, rubs, gallops. ?Pulmonary: normal work of breathing on RA, lungs clear to auscultation, no rales, wheezes, rhonchi ?Abdominal: non-distended, soft, non-tender to palpation, normal BS ?Skin: Warm and dry, no rashes or lesions ?Neurological: ?MS: awake, alert and oriented x3, very mild slurring of speech, normal fund of knowledge, naming appropriately, repetition intact ?Cranial Nerves: ?II: Pupils equal, round, and reactive to light.  ?III,IV, VI: EOMI all cardinal directions, without ptosis or diploplia.  ?V: Facial sensation is symmetric  ?VII: Face appears symmetric.  ?VIII: Hearing is intact to voice ?X: Uvula and palate elevate symmetrically ?XI: Shoulder shrug is symmetric. ?XII: Tongue is midline without atrophy or fasciculations.  ?Motor: Left upper and left lower extremity 5/5, RUE 4/5, right hip flexor 4/5, right plantar and dorsiflexion 5/5 ?Sensory: Sensation is grossly intact bilateral UEs & LEs ?Coordination: RAM and HTS intact bilaterally ?Psych: normal affect ? ? ?Assessment/Plan: ? ?Principal Problem: ?  Left  pontine stroke (HCC) ? ?Savannah Conner is a 56 y.o. female with PMH uncontrolled DM2, HLD, with recent hx of L pontine stroke, who presented to [facility] (02/06/2022) for RHB weakness and slurred speech and admitted for stroke extension work-up. ? ?#Expansion left pontine ischemic infarct ?Patient evaluated for RHB weakness and slurred speech. Neurology consulted for code stroke in the ED. MRI brain shows left pontine stroke identified on 4/17 has expanded.  Neurology (Dr. 5/17) suspects lacunar infarct in territory of perforator as well as expanded.  Exam notable for 4/5 RUE and RLE weakness.  Mild slurring of speech. Patient may be a candidate for CIR. ?Plan: ?-Neurology following, appreciate recommendations ?-No further stroke work-up ?-Continue DAPT (aspirin 81 mg and now on Brilinta 90 mg twice daily) ?-Continue Lipitor 80 mg daily ?-Permissive hypertension, treat only for SBP >220 ?-PT/OT/SLP eval and treat ? ?#Poorly controlled type 2 diabetes with hyperglycemia ?#Diabetic retinopathy ?A1c of 12.4% April 2023.  Patient has been without a PCP since 2021 and is working on obtaining health insurance to afford her medications.  During her last admission, she was discharged on Lantus 20 units nightly with plan for close follow-up with [facility] as her new PCP ?Plan: ?-Semglee 20 units nightly ?-SSI with meals ?-CBGs with meals and nightly ? ?Diet: CM ?VTE: We will initiate DVT prophylaxis if patient skilled for CIR ?IVF: None ?Code: Full ? ?Prior to Admission Living Arrangement: Home ?Anticipated Discharge Location: TBD ?Barriers to Discharge: PT/OT evaluations ?Dispo: Anticipated discharge in approximately 1 day(s).  ? ?Portions of this report may have been transcribed using  voice recognition software. Every effort was made to ensure accuracy; however, inadvertent computerized transcription errors may be present.  ? ?Ellison Carwin, MD ?02/07/22,  8:48 AM ?Pager: [phone] ?Internal Medicine Resident,  PGY-1 ?[facility]  ?   ?Please contact the on call pager after 5 pm and on weekends at [phone]. ? ?

## 2022-02-07 NOTE — Evaluation (Signed)
Physical Therapy Evaluation ?Patient Details ?Name: Savannah Conner ?MRN: [id number] ?DOB: May 05, 1966 ?Today's Date: 02/07/2022 ? ?History of Present Illness ? Pt is a 56 y/o female admitted following fall and RLE and RUE weakness. Per notes, Stroke vs recrudescence of previous infarct in L pontine area. PMH includes HTN, CVA, and DM.  ?Clinical Impression ? Pt admitted secondary to problem above with deficits below. Pt with increased unsteadiness and one LOB during ambulation using RW requiring mod A for steadying. Otherwise requiring min A for remainder of mobility tasks. Pt reports feeling more unsteady and reports having difficulty caring for herself. Recommending CIR level therapies to increase independence and safety prior to return home. Will continue to follow acutely.    ?   ? ?Recommendations for follow up therapy are one component of a multi-disciplinary discharge planning process, led by the attending physician.  Recommendations may be updated based on patient status, additional functional criteria and insurance authorization. ? ?Follow Up Recommendations Acute inpatient rehab (3hours/day) ? ?  ?Assistance Recommended at Discharge Frequent or constant Supervision/Assistance  ?Patient can return home with the following ? A little help with walking and/or transfers;A little help with bathing/dressing/bathroom;Assistance with cooking/housework;Assist for transportation ? ?  ?Equipment Recommendations Rolling walker (2 wheels)  ?Recommendations for Other Services ?    ?  ?Functional Status Assessment Patient has had a recent decline in their functional status and demonstrates the ability to make significant improvements in function in a reasonable and predictable amount of time.  ? ?  ?Precautions / Restrictions Precautions ?Precautions: Fall ?Restrictions ?Weight Bearing Restrictions: No  ? ?  ? ?Mobility ? Bed Mobility ?Overal bed mobility: Needs Assistance ?Bed Mobility: Sit to Supine ?  ?  ?  ?Sit to  supine: Min guard ?  ?General bed mobility comments: Min guard for safety ?  ? ?Transfers ?Overall transfer level: Needs assistance ?Equipment used: Rolling walker (2 wheels) ?Transfers: Sit to/from Stand ?Sit to Stand: Min assist ?  ?  ?  ?  ?  ?General transfer comment: Min A for steadying assist. ?  ? ?Ambulation/Gait ?Ambulation/Gait assistance: Min assist, Mod assist ?Gait Distance (Feet): 75 Feet ?Assistive device: Rolling walker (2 wheels) ?Gait Pattern/deviations: Step-through pattern, Decreased stride length, Staggering right ?Gait velocity: Decreased ?  ?  ?General Gait Details: Slow, unsteady gait. Cues for safe use of RW as pt liked to leave behind once in bathroom and once in room. One LOB noted requiring mod A, otherwise requiring min A for stability. ? ?Stairs ?  ?  ?  ?  ?  ? ?Wheelchair Mobility ?  ? ?Modified Rankin (Stroke Patients Only) ?Modified Rankin (Stroke Patients Only) ?Pre-Morbid Rankin Score: Slight disability ?Modified Rankin: Moderately severe disability ? ?  ? ?Balance Overall balance assessment: Needs assistance ?Sitting-balance support: Feet supported, No upper extremity supported ?Sitting balance-Leahy Scale: Fair ?  ?  ?Standing balance support: Bilateral upper extremity supported ?Standing balance-Leahy Scale: Poor ?Standing balance comment: Reliant on BUE support ?  ?  ?  ?  ?  ?  ?  ?  ?  ?  ?  ?   ? ? ? ?Pertinent Vitals/Pain Pain Assessment ?Pain Assessment: No/denies pain  ? ? ?Home Living Family/patient expects to be discharged to:: Private residence ?Living Arrangements: Alone ?Available Help at Discharge: Friend(s);Available PRN/intermittently ?Type of Home: Apartment ?Home Access: Elevator ?  ?  ?  ?Home Layout: One level ?Home Equipment: [place] Mor - single point ?   ?  ?Prior Function Prior Level  of Function : Independent/Modified Independent ?  ?  ?  ?  ?  ?  ?Mobility Comments: Has been using cane since previous admission ?  ?  ? ? ?Hand Dominance  ?   ? ?   ?Extremity/Trunk Assessment  ? Upper Extremity Assessment ?Upper Extremity Assessment: Defer to OT evaluation ?  ? ?Lower Extremity Assessment ?Lower Extremity Assessment: Generalized weakness;RLE deficits/detail ?RLE Deficits / Details: Reports progressive weakness since previous CVA ?RLE Coordination: decreased gross motor ?  ? ?   ?Communication  ? Communication: No difficulties  ?Cognition Arousal/Alertness: Awake/alert ?Behavior During Therapy: [facility] for tasks assessed/performed ?Overall Cognitive Status: No family/caregiver present to determine baseline cognitive functioning ?  ?  ?  ?  ?  ?  ?  ?  ?  ?  ?  ?  ?  ?  ?  ?  ?General Comments: Easily distracted during mobility tasks. Cues for safe use of RW. ?  ?  ? ?  ?General Comments   ? ?  ?Exercises    ? ?Assessment/Plan  ?  ?PT Assessment Patient needs continued PT services  ?PT Problem List Decreased balance;Decreased activity tolerance;Decreased mobility;Decreased safety awareness;Decreased knowledge of use of DME ? ?   ?  ?PT Treatment Interventions DME instruction;Gait training;Functional mobility training;Therapeutic activities;Therapeutic exercise;Balance training;Patient/family education   ? ?PT Goals (Current goals can be found in the Care Plan section)  ?Acute Rehab PT Goals ?Patient Stated Goal: to get stronger ?PT Goal Formulation: With patient ?Time For Goal Achievement: 02/21/22 ?Potential to Achieve Goals: Good ? ?  ?Frequency Min 4X/week ?  ? ? ?Co-evaluation   ?  ?  ?  ?  ? ? ?  ?AM-PAC PT "6 Clicks" Mobility  ?Outcome Measure Help needed turning from your back to your side while in a flat bed without using bedrails?: A Little ?Help needed moving from lying on your back to sitting on the side of a flat bed without using bedrails?: A Little ?Help needed moving to and from a bed to a chair (including a wheelchair)?: A Little ?Help needed standing up from a chair using your arms (e.g., wheelchair or bedside chair)?: A Little ?Help needed to walk  in hospital room?: A Lot ?Help needed climbing 3-5 steps with a railing? : A Lot ?6 Click Score: 16 ? ?  ?End of Session   ?Activity Tolerance: Patient tolerated treatment well ?Patient left: in bed;with call bell/phone within reach (on stretcher in ED) ?Nurse Communication: Mobility status ?PT Visit Diagnosis: Unsteadiness on feet (R26.81);Muscle weakness (generalized) (M62.81) ?  ? ?Time: 2952-8413 ?PT Time Calculation (min) (ACUTE ONLY): 12 min ? ? ?Charges:   PT Evaluation ?$PT Eval Moderate Complexity: 1 Mod ?  ?  ?   ? ? ?Farley Ly, PT, DPT  ?Acute Rehabilitation Services  ?Pager: [phone] ?Office: [phone] ? ? ?[place] S Kashena Novitski ?02/07/2022, 2:55 PM ?

## 2022-02-07 NOTE — Progress Notes (Signed)
? ?  Inpatient Rehab Admissions Coordinator : ? ?Per therapy recommendations, patient was screened for CIR candidacy by Ravynn Hogate RN MSN.  At this time patient appears to be a potential candidate for CIR. I will place a rehab consult per protocol for full assessment. Please call me with any questions. ? ?Charlina Dwight RN MSN ?Admissions Coordinator ?[phone] ?  ?

## 2022-02-07 NOTE — Progress Notes (Addendum)
STROKE TEAM PROGRESS NOTE  ? ?INTERVAL HISTORY ?No family at the bedside.  Patient seen in ED.  She recently had a stroke work-up and was discharged on 01/31/22 with DAPT therapy for 3 weeks.  She does have an appointment coming up with the internal medicine clinic on 4/28.  Plan to work-up for sleep smart study as she has never been tested for sleep apnea, but does have risk factors. ?  ? ?Vitals:  ? 02/07/22 0630 02/07/22 0800 02/07/22 0815 02/07/22 1200  ?BP: (!) 157/63 127/67 (!) 143/63 (!) 158/68  ?Pulse: 73 76 76 77  ?Resp: 16 15 16 12   ?Temp:      ?TempSrc:      ?SpO2: 97% 100% 98% 100%  ?Weight:      ?Height:      ? ?CBC:  ?Recent Labs  ?Lab 02/06/22 ?1208 02/06/22 ?1224 02/07/22 ?0251  ?WBC 10.0  --  8.6  ?NEUTROABS 7.2  --   --   ?HGB 13.4 13.3 12.9  ?HCT 40.2 39.0 37.6  ?MCV 91.0  --  90.0  ?PLT 271  --  266  ? ?Basic Metabolic Panel:  ?Recent Labs  ?Lab 02/06/22 ?1208 02/06/22 ?1224 02/07/22 ?0251  ?NA 137 139  --   ?K 3.8 3.9  --   ?CL 103 102  --   ?CO2 24  --   --   ?GLUCOSE 256* 263*  --   ?BUN 12 13  --   ?CREATININE 0.64 0.50 0.55  ?CALCIUM 9.0  --   --   ? ?Lipid Panel: No results for input(s): CHOL, TRIG, HDL, CHOLHDL, VLDL, LDLCALC in the last 168 hours. ?HgbA1c: No results for input(s): HGBA1C in the last 168 hours. ?Urine Drug Screen:  ?Recent Labs  ?Lab 02/06/22 ?1700  ?LABOPIA NONE DETECTED  ?COCAINSCRNUR NONE DETECTED  ?LABBENZ NONE DETECTED  ?AMPHETMU NONE DETECTED  ?THCU NONE DETECTED  ?LABBARB NONE DETECTED  ?  ?Alcohol Level  ?Recent Labs  ?Lab 02/06/22 ?1208  ?ETH <10  ? ? ?IMAGING past 24 hours ?MR ANGIO HEAD WO CONTRAST ? ?Result Date: 02/06/2022 ?CLINICAL DATA:  Stroke follow-up EXAM: MRI HEAD WITHOUT CONTRAST MRA HEAD WITHOUT CONTRAST TECHNIQUE: Multiplanar, multi-echo pulse sequences of the brain and surrounding structures were acquired without intravenous contrast. Angiographic images of the Circle of Willis were acquired using MRA technique without intravenous contrast.  COMPARISON:  Head CT from earlier today.  Brain MRI 01/29/2022 FINDINGS: MRI HEAD FINDINGS Brain: More confluent acute/subacute infarction in the left pons. No hemorrhage, hydrocephalus, or collection. Small remote microhemorrhage at the right caudate. Remote right inferior pontine lacunar infarct. Brain volume is normal Vascular: Major flow voids are preserved.  MRA described below. Skull and upper cervical spine: Unremarkable marrow signal Sinuses/Orbits: Bilateral cataract resection MRA HEAD FINDINGS Anterior circulation: Mild generalized motion artifact. Atheromatous irregularity of the cavernous carotids without flow reducing stenosis. Mild narrowing at the right M1 segment. Probable atheromatous irregularity of medium size branches. Negative for aneurysm or vascular malformation. Posterior circulation: The vertebral and basilar arteries are smoothly contoured and diffusely patent. Proximal posterior cerebral arteries are smoothly contoured and diffusely patent. Atheromatous irregularity of PCA branch vessels. Negative for aneurysm or vascular malformation. IMPRESSION: Brain MRI: 1. Progressive size of the acute left pontine infarct when compared to 01/29/2022. 2. Remote right pontine lacunar infarct. Intracranial MRA: 1. No emergent finding.  Unremarkable appearance of the basilar. 2. Carotid siphon and branch vessel atherosclerosis. Electronically Signed   By: Gilford Silvius.D.  On: 02/06/2022 20:32  ? ?MR BRAIN WO CONTRAST ? ?Result Date: 02/06/2022 ?CLINICAL DATA:  Stroke follow-up EXAM: MRI HEAD WITHOUT CONTRAST MRA HEAD WITHOUT CONTRAST TECHNIQUE: Multiplanar, multi-echo pulse sequences of the brain and surrounding structures were acquired without intravenous contrast. Angiographic images of the Circle of Willis were acquired using MRA technique without intravenous contrast. COMPARISON:  Head CT from earlier today.  Brain MRI 01/29/2022 FINDINGS: MRI HEAD FINDINGS Brain: More confluent acute/subacute  infarction in the left pons. No hemorrhage, hydrocephalus, or collection. Small remote microhemorrhage at the right caudate. Remote right inferior pontine lacunar infarct. Brain volume is normal Vascular: Major flow voids are preserved.  MRA described below. Skull and upper cervical spine: Unremarkable marrow signal Sinuses/Orbits: Bilateral cataract resection MRA HEAD FINDINGS Anterior circulation: Mild generalized motion artifact. Atheromatous irregularity of the cavernous carotids without flow reducing stenosis. Mild narrowing at the right M1 segment. Probable atheromatous irregularity of medium size branches. Negative for aneurysm or vascular malformation. Posterior circulation: The vertebral and basilar arteries are smoothly contoured and diffusely patent. Proximal posterior cerebral arteries are smoothly contoured and diffusely patent. Atheromatous irregularity of PCA branch vessels. Negative for aneurysm or vascular malformation. IMPRESSION: Brain MRI: 1. Progressive size of the acute left pontine infarct when compared to 01/29/2022. 2. Remote right pontine lacunar infarct. Intracranial MRA: 1. No emergent finding.  Unremarkable appearance of the basilar. 2. Carotid siphon and branch vessel atherosclerosis. Electronically Signed   By: Jorje Guild M.D.   On: 02/06/2022 20:32   ? ?PHYSICAL EXAM ?Physical Exam  ?Constitutional: Appears mildly obese middle-aged lady not in distress.   ?Cardiovascular: Normal rate and regular rhythm.  ?Respiratory: Effort normal, non-labored breathing ?  ?Neuro: ?Mental Status: ?Patient is awake, alert, oriented to person, place, month, year, and situation. ?Patient is able to give a clear and coherent history. ?No signs of aphasia or neglect ?Cranial Nerves: ?II: Visual Fields are full. Pupils are equal, round, and reactive to light.   ?III,IV, VI: EOMI without ptosis or diploplia.  ?V: Facial sensation is symmetric to temperature ?VII: Facial movement is symmetric resting  and smiling ?VIII: Hearing is intact to voice ?X: Palate elevates symmetrically ?XI: Shoulder shrug is symmetric. ?XII: Tongue protrudes midline without atrophy or fasciculations.  ?Motor: ?Tone is normal. Bulk is normal. 5/5 strength was present in all four extremities.  ?Sensory: ?Sensation is symmetric to light touch and temperature in the arms and legs. No extinction to DSS present.  ?Cerebellar: ?Mild right finger-to-nose and knee to heel ataxia.  RAM slowed bilaterally.   ? ?NIHSS 2.  Premorbid modified Rankin score 2 ? ?ASSESSMENT/PLAN ?[name] is a 56 y.o. female with history of hypertension, left pontine infarct, and diabetic retinopathy presenting with weakness in her bilateral lower extremities, right hand s/p fall. She fell towards the right side and could not grasp items with her right hand.  She endorses a headache and bowel/bladder incontinence.  She denies loss of consciousness, tingling sensations, nausea/vomiting, palpitations, or blurry vision.  MRI shows a progressive increase in the size of the left pontine infarct. ? ?Stroke vs recrudescence of previous infarct:  Increase in size of previous left pointine infarct  likely secondary small vessel disease source ?Code Stroke Head CT- There is 7 mm low-density in the posterior aspect of left side of pons.There is 9 mm low-density in the anterior aspect of left side of pons suggesting another focus of infarct which appears larger in comparison with the infarct seen in the MRI, possibly suggesting  increase in size of anterior left pontine infarct. ?MRI progressive size of the acute left pontine infarct.  Remote right pontine lacunar infarct ?MRA-no emergent finding ?2D Echo  EF [id number], Grade I diastolic dysfunction.  ?LDL 169 ?HgbA1c 12.4 ?VTE prophylaxis - SCDs ?   ?Diet  ? Diet Carb Modified Fluid consistency: Thin; Room service appropriate? Yes  ? ?aspirin 81 mg daily and clopidogrel 75 mg daily prior to admission, now on aspirin 81 mg  daily and Brilinta (ticagrelor) 90 mg bid.x 4 weeks and then aspirin alone ?Therapy recommendations: Pending ?Disposition: Pending ? ?Hypertension ?Home meds:  None ?Stable ?Permissive hypertension (OK if < 220/12

## 2022-02-07 NOTE — H&P (Signed)
? ? ? ?Date: 02/07/2022     ?     ?     ?Patient Name:  Savannah Conner MRN: [id number]  ?DOB: 12/30/1965 Age / Sex: 56 y.o., female   ?PCP: Pcp, No    ?     ?Medical Service: Internal Medicine Teaching Service    ?     ?Attending Physician: Dr. Campbell Riches, MD    ?First Contact: Dr. Vinetta Bergamo Pager: [phone]  ?Second Contact: Dr. Lisabeth Devoid Pager: [phone]  ?     ?After Hours (After 5p/  First Contact Pager: [phone]  ?weekends / holidays): Second Contact Pager: [phone]  ? ?Chief Complaint: Worsening weakness and difficulty speaking ? ?History of Present Illness: Savannah Conner is a 56 y.o. female with a past medical history of uncontrolled type 2 diabetes c/b diabetic retinopathy, remote right pontine stroke, and with a recent hospitalization during which she was found to have an acute left pontine stroke, who returns to [facility], ED with chief complaint of worsening weakness and worsening expressive aphasia.  ? ?The patient was discharged from the hospital on 4/19, and the patient states that she was doing well until about 24 hours ago, when she went up to go to the bathroom and started feeling weak in her right leg.  At that time, she fell onto her knees and had an episode of bowel and bladder incontinence.  She has not been incontinent since, however she continues to feel weak in the right leg and has even more difficulty with speech since she was discharged.  Also endorses headache in the occipital region that goes to the side of her head, which she states is new since she was discharged.  Now, she also has numbness in her right hand only.  Per review of records, she was taking her Plavix and aspirin daily as directed as well as her statin and insulin.  Denies chest pain, states she has some shortness of breath at baseline which has been unchanged since discharge.  No nausea/vomiting.  No other complaints or concerns today. ? ?Meds:  ?Current Meds  ?Medication Sig  ? aspirin 81 MG EC tablet Take 1 tablet  (81 mg total) by mouth daily for 18 days. Swallow whole.  ? atorvastatin (LIPITOR) 80 MG tablet Take 1 tablet (80 mg total) by mouth daily.  ? clopidogrel (PLAVIX) 75 MG tablet Take 1 tablet (75 mg total) by mouth daily.  ? insulin glargine (LANTUS) 100 UNIT/ML Solostar Pen Inject 20 Units into the skin at bedtime.  ? ?Allergies: ?Allergies as of 02/06/2022 - Review Complete 02/06/2022  ?Allergen Reaction Noted  ? Codeine Other (See Comments) 07/06/2021  ? ?Past Medical History:  ?Diagnosis Date  ? Diabetes mellitus without complication ([place])   ? Stroke [facility])   ? ?Family History: 2 sisters, 1 with DM and HTN, sister with hearing condition, Mother died of breast CA diagnosed in 27s lived to 6. Denies history of stroke or neurological disorders. Never knew her father. Daughter with bipolar disorder.  ?  ?Social History: Denies current regular alcohol, illicit drug, and tobacco use.  Former smoker, started at age 60, smoked about 3 cigarettes a day. Stopped briefly with pregnancies, then began again in 02/19/12 after the death of her husband. Stopped 5-6 years ago.  No supplements.  Lives alone, has 3 adult children who live in [place].  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 Independent with ADLs and IADLs. ? ?Review  of Systems: ?A complete ROS was negative except as per HPI.  ? ?Physical Exam: ?Blood pressure 129/78, pulse 76, temperature 98.2 ?F (36.8 ?C), temperature source Oral, resp. rate (!) 21, height 5\' 2"  (1.575 m), weight 73.5 kg, SpO2 99 %. ?General: NAD, nl appearance ?HE: Normocephalic, atraumatic, EOMI, Conjunctivae normal ?ENT: No congestion, no rhinorrhea, no exudate or erythema  ?Cardiovascular: Normal rate, regular rhythm. No murmurs, rubs, or gallops ?Pulmonary: Effort normal, breath sounds normal. No wheezes, rales, or rhonchi ?Abdominal: soft, nontender, bowel sounds present ?Musculoskeletal: no swelling, deformity, injury or tenderness in extremities ?Skin:  Warm, dry, no bruising, erythema, or rash ?Neurologic exam: ?Mental status: A&Ox3 ?Cranial Nerves: ?            II: PERRL ?            III, IV, VI: Extra-occular motions intact bilaterally ?            V, VII: Face symmetric, sensation intact in all 3 divisions   ?            VIII: Hearing is grossly normal bilaterally ?            IX, X: Palate rises symmetrically ?            XI: Shoulder shrug normal bilaterally   ?            XII: Tongue is midline    ?Motor: Strength 4/5 in RUE and RLE.  Strength 5/5 in LUE and LLE.  Pronator drift is present on the right side. ?Sensory: Decreased sensation to light touch over right hand, but otherwise intact throughout. ?Coordination: There is dysmetria , R > L. Heel to shin test normal. ?Psychiatric: Normal mood and affect ? ? ?EKG: personally reviewed my interpretation is NSR ? ?CT Head WO Contrast: ?IMPRESSION: ?There are no signs of bleeding within the cranium. There is no focal ?mass effect. ?There is 7 mm low-density in the posterior aspect of left side of ?pons which corresponds to an infarct noted in the previous MR brain ?done on 01/29/2022. There is 9 mm low-density in the anterior aspect ?of left side of pons suggesting another focus of infarct which ?appears larger in comparison with the infarct seen in the MRI, ?possibly suggesting increase in size of anterior left pontine ?infarct. ? ?MRI Brain WO Contrast: ?MR Angio Head WO Contrast: ?Brain MRI: ?1. Progressive size of the acute left pontine infarct when compared ?to 01/29/2022. ?2. Remote right pontine lacunar infarct. ?Intracranial MRA: ?1. No emergent finding.  Unremarkable appearance of the basilar. ?2. Carotid siphon and branch vessel atherosclerosis. ? ? ?Assessment & Plan by Problem: ?Principal Problem: ?  Left pontine stroke ([place]) ? ?Progression of acute left pontine infarct ?Remote right pontine lacunar infarct ?HLD ?Etiology is likely small vessel disease in the setting of longstanding uncontrolled  hypertension/diabetes. During her last hospitalization, plan was DAPT for 3 weeks followed by Plavix monotherapy. She was also started on Lipitor 80 mg daily. She presents today with worsening weakness and expressive aphasia.  On exam today, she has 4/5 strength on her right side, which is worse when compared to prior documented exams.  Imaging today shows progressive size of the acute left pontine infarct when compared to imaging from 4/17.  Neurology has already evaluated the patient and does not plan on repeating stroke work-up at this time.  Plan is to continue DAPT and have PT/OT reevaluate the patient given her worsening symptoms. ?-Neurology is on board, appreciate  their recommendations ?-DAPT with aspirin and Plavix for 3 weeks, followed by Plavix alone ?-Atorvastatin 80 mg daily ?-Telemetry ?-PT/OT eval ?-Permissive HTN to 220/120 for 24-48 hours ?-NPO pending speech eval ?-Frequent neuro checks ? ?Poorly controlled type 2 diabetes with hyperglycemia ?Diabetic retinopathy ?A1c of 12.4 noted during her last admission.  Patient has been without a PCP since 2021 and is working on obtaining health insurance to afford her medications.  During her last admission, she was discharged on Lantus 20 units nightly with plan for close follow-up with [facility] as her new PCP. Glucose of 256 on her most recent CMP. ?-Semglee 20 units nightly ?-CBG monitoring ?-If passes swallow screen, initiate SSI ? ?Dispo: Admit patient to Inpatient with expected length of stay greater than 2 midnights. ? ?Signed: ?Orvis Brill, MD ?02/07/2022, 1:15 AM  ?Pager: [phone]  ?After 5pm on weekdays and 1pm on weekends: On Call pager: [phone] ? ?

## 2022-02-07 NOTE — Evaluation (Signed)
Occupational Therapy Evaluation ?Patient Details ?Name: Savannah Conner ?MRN: [id number] ?DOB: Oct 13, 1966 ?Today's Date: 02/07/2022 ? ? ?History of Present Illness Pt is a 56 y/o female admitted following fall and RLE and RUE weakness. Per notes, Stroke vs recrudescence of previous infarct in L pontine area. PMH includes HTN, CVA, and DM.  ? ?Clinical Impression ?  ?PT admitted with CVA. Pt currently with functional limitiations due to the deficits listed below (see OT problem list). Pt prior to admission was indep and living alone. Pt has a son in the area. Pt with decreased balance and R UE coordination/ strength.  Pt will benefit from skilled OT to increase their independence and safety with adls and balance to allow discharge CIR. ?  ?   ? ?Recommendations for follow up therapy are one component of a multi-disciplinary discharge planning process, led by the attending physician.  Recommendations may be updated based on patient status, additional functional criteria and insurance authorization.  ? ?Follow Up Recommendations ? Acute inpatient rehab (3hours/day)  ?  ?Assistance Recommended at Discharge    ?Patient can return home with the following A little help with walking and/or transfers;A little help with bathing/dressing/bathroom;Assistance with cooking/housework;Assistance with feeding;Direct supervision/assist for financial management ? ?  ?Functional Status Assessment ? Patient has had a recent decline in their functional status and demonstrates the ability to make significant improvements in function in a reasonable and predictable amount of time.  ?Equipment Recommendations ? Other (comment) (RW)  ?  ?Recommendations for Other Services Rehab consult ? ? ?  ?Precautions / Restrictions Precautions ?Precautions: Fall ?Restrictions ?Weight Bearing Restrictions: No  ? ?  ? ?Mobility Bed Mobility ?Overal bed mobility: Needs Assistance ?Bed Mobility: Sit to Supine ?  ?  ?  ?Sit to supine: Min assist ?  ?General  bed mobility comments: exiting on the R side. Pt using L hand to pull on OT with cues to push up on R UE. Ot cued pt that using L hand is cheating for side to sit and pt giggled. ?  ? ?Transfers ?Overall transfer level: Needs assistance ?Equipment used: Rolling walker (2 wheels) ?Transfers: Sit to/from Stand ?Sit to Stand: Min guard ?  ?  ?  ?  ?  ?General transfer comment: pt able to release in static stand with posterior LOB x1 out of 4 trials. pt able to side step and step foward without buckle ?  ? ?  ?Balance Overall balance assessment: Needs assistance ?Sitting-balance support: Bilateral upper extremity supported, Feet supported ?Sitting balance-Leahy Scale: Fair ?  ?  ?Standing balance support: Bilateral upper extremity supported, During functional activity, Reliant on assistive device for balance ?Standing balance-Leahy Scale: Poor ?  ?  ?  ?  ?  ?  ?  ?  ?  ?  ?  ?  ?   ? ?ADL either performed or assessed with clinical judgement  ? ?ADL Overall ADL's : Needs assistance/impaired ?Eating/Feeding: Minimal assistance;Sitting ?  ?Grooming: Therapist, nutritional;Wash/dry hands;Oral care;Minimal assistance;With adaptive equipment;Sitting ?  ?Upper Body Bathing: Minimal assistance;Sitting ?  ?Lower Body Bathing: Moderate assistance;Sit to/from stand ?  ?  ?  ?  ?  ?Toilet Transfer: Min guard;Ambulation;Rolling walker (2 wheels) ?Toilet Transfer Details (indicate cue type and reason): simulated with chair and bed transfer ?  ?  ?  ?  ?  ?General ADL Comments: pt reports prior to admission doing all adls and iadls. pt reports needed DME for balance recently. pt reports decreasd control of R hand  ? ? ? ?  Vision Baseline Vision/History: 1 Wears glasses ?Ability to See in Adequate Light:  (reading) ?Visual Fields: No apparent deficits  ?   ?Perception   ?  ?Praxis   ?  ? ?Pertinent Vitals/Pain Pain Assessment ?Pain Assessment: No/denies pain  ? ? ? ?Hand Dominance Right ?  ?Extremity/Trunk Assessment Upper Extremity  Assessment ?Upper Extremity Assessment: RUE deficits/detail ?RUE Deficits / Details: difficulty with pad to pad. decrease grasp, ataxic movement. pt with decreased awareness when bring hand to mouth when to stop with spoon . provided rolled wash cloth for self feeding with increased control and grasp ?  ?Lower Extremity Assessment ?Lower Extremity Assessment: Defer to PT evaluation ?RLE Deficits / Details: Reports progressive weakness since previous CVA ?RLE Coordination: decreased gross motor ?  ?Cervical / Trunk Assessment ?Cervical / Trunk Assessment: Normal ?  ?Communication Communication ?Communication: No difficulties ?  ?Cognition Arousal/Alertness: Awake/alert ?Behavior During Therapy: [facility] for tasks assessed/performed ?Overall Cognitive Status: Within Functional Limits for tasks assessed ?  ?  ?  ?  ?  ?  ?  ?  ?  ?  ?  ?  ?  ?  ?  ?  ?  ?  ?  ?General Comments  BP 122/61 supine and 156/71 with movement ? ?  ?Exercises Exercises: Other exercises ?Other Exercises ?Other Exercises: fine motor exercises- open close tight fist, pad to pad and single digit lift. encouraged L hand with R hand to help with recovery ?  ?Shoulder Instructions    ? ? ?Home Living Family/patient expects to be discharged to:: Private residence ?Living Arrangements: Alone ?Available Help at Discharge: Friend(s);Available PRN/intermittently ?Type of Home: Apartment ?Home Access: Elevator ?  ?  ?Home Layout: One level ?  ?  ?Bathroom Shower/Tub: Walk-in shower ?  ?Bathroom Toilet: Standard ?  ?  ?Home Equipment: Gilmer Mor - single point ?  ?  ?  ? ?  ?Prior Functioning/Environment Prior Level of Function : Independent/Modified Independent ?  ?  ?  ?  ?  ?  ?Mobility Comments: Has been using cane since previous admission ?ADLs Comments: indep ?  ? ?  ?  ?OT Problem List: Impaired balance (sitting and/or standing);Impaired UE functional use ?  ?   ?OT Treatment/Interventions: Self-care/ADL training;Therapeutic exercise;DME and/or AE  instruction;Therapeutic activities;Cognitive remediation/compensation;Patient/family education;Balance training  ?  ?OT Goals(Current goals can be found in the care plan section) Acute Rehab OT Goals ?Patient Stated Goal: to be able to use R hand again ?OT Goal Formulation: With patient ?Time For Goal Achievement: 02/20/22 ?Potential to Achieve Goals: Good  ?OT Frequency: Min 2X/week ?  ? ?Co-evaluation   ?  ?  ?  ?  ? ?  ?AM-PAC OT "6 Clicks" Daily Activity     ?Outcome Measure Help from another person eating meals?: A Little ?Help from another person taking care of personal grooming?: A Little ?Help from another person toileting, which includes using toliet, bedpan, or urinal?: A Little ?Help from another person bathing (including washing, rinsing, drying)?: A Little ?Help from another person to put on and taking off regular upper body clothing?: A Little ?Help from another person to put on and taking off regular lower body clothing?: A Little ?6 Click Score: 18 ?  ?End of Session Equipment Utilized During Treatment: Gait belt ?Nurse Communication: Mobility status;Precautions ? ?Activity Tolerance: Patient tolerated treatment well ?Patient left: in bed;with call bell/phone within reach (in ED so no bed alarm) ? ?OT Visit Diagnosis: Unsteadiness on feet (R26.81)  ?              ?  Time: [id number] ?OT Time Calculation (min): 20 min ?Charges:  OT General Charges ?$OT Visit: 1 Visit ?OT Evaluation ?$OT Eval Moderate Complexity: 1 Mod ? ? ?Brynn, OTR/L  ?Acute Rehabilitation Services ?Office: [phone] ?. ? ? ?Mateo FlowBrynn Tammye Kahler ?02/07/2022, 3:49 PM ?

## 2022-02-08 ENCOUNTER — Other Ambulatory Visit (HOSPITAL_COMMUNITY): Payer: Self-pay

## 2022-02-08 LAB — GLUCOSE, CAPILLARY
Glucose-Capillary: 155 mg/dL — ABNORMAL HIGH (ref 70–99)
Glucose-Capillary: 165 mg/dL — ABNORMAL HIGH (ref 70–99)

## 2022-02-08 MED ORDER — TICAGRELOR 90 MG PO TABS
90.0000 mg | ORAL_TABLET | Freq: Two times a day (BID) | ORAL | 0 refills | Status: AC
Start: 2022-02-08 — End: 2022-03-10
  Filled 2022-02-08: qty 60, 30d supply, fill #0

## 2022-02-08 MED ORDER — ATORVASTATIN CALCIUM 80 MG PO TABS
80.0000 mg | ORAL_TABLET | Freq: Every day | ORAL | 0 refills | Status: AC
Start: 2022-02-08 — End: 2022-03-10
  Filled 2022-02-08: qty 30, 30d supply, fill #0

## 2022-02-08 MED ORDER — ASPIRIN 81 MG PO TBEC
81.0000 mg | DELAYED_RELEASE_TABLET | Freq: Every day | ORAL | 0 refills | Status: AC
Start: 2022-02-08 — End: 2022-03-10
  Filled 2022-02-08: qty 30, 30d supply, fill #0

## 2022-02-08 MED ORDER — INSULIN GLARGINE 100 UNIT/ML SOLOSTAR PEN
20.0000 [IU] | PEN_INJECTOR | Freq: Every day | SUBCUTANEOUS | 0 refills | Status: AC
  Filled 2022-02-08: qty 30, 150d supply, fill #0

## 2022-02-08 MED ORDER — INSULIN PEN NEEDLE 32G X 4 MM MISC
0 refills | Status: AC
  Filled 2022-02-08: qty 100, fill #0

## 2022-02-08 NOTE — Progress Notes (Signed)
STROKE TEAM PROGRESS NOTE  ? ?INTERVAL HISTORY ?No family at the bedside.  Patient sitting up comfortably in bed.  Echocardiogram shows ejection fraction 60 to 65% with no regional wall motion abnormalities.  She has had no recurrent stroke or TIA symptoms.  She decided not to participate in the sleep smart study as she does not want to go to inpatient rehab but wants to go live with her sister in [place].  Therapist recommend inpatient rehab ?Vitals:  ? 02/07/22 1711 02/07/22 2045 02/08/22 0830 02/08/22 1207  ?BP: (!) 145/69 140/65 132/75 136/63  ?Pulse: 72 73 88 81  ?Resp: 18 18 15 16   ?Temp: (!) 97.5 ?F (36.4 ?C) 98.2 ?F (36.8 ?C) 97.9 ?F (36.6 ?C) 98.2 ?F (36.8 ?C)  ?TempSrc: Oral Oral Oral Oral  ?SpO2: 95% 98% 96% 95%  ?Weight:      ?Height:      ? ?CBC:  ?Recent Labs  ?Lab 02/06/22 ?1208 02/06/22 ?1224 02/07/22 ?0251  ?WBC 10.0  --  8.6  ?NEUTROABS 7.2  --   --   ?HGB 13.4 13.3 12.9  ?HCT 40.2 39.0 37.6  ?MCV 91.0  --  90.0  ?PLT 271  --  266  ? ?Basic Metabolic Panel:  ?Recent Labs  ?Lab 02/06/22 ?1208 02/06/22 ?1224 02/07/22 ?0251  ?NA 137 139  --   ?K 3.8 3.9  --   ?CL 103 102  --   ?CO2 24  --   --   ?GLUCOSE 256* 263*  --   ?BUN 12 13  --   ?CREATININE 0.64 0.50 0.55  ?CALCIUM 9.0  --   --   ? ?Lipid Panel: No results for input(s): CHOL, TRIG, HDL, CHOLHDL, VLDL, LDLCALC in the last 168 hours. ?HgbA1c: No results for input(s): HGBA1C in the last 168 hours. ?Urine Drug Screen:  ?Recent Labs  ?Lab 02/06/22 ?1700  ?LABOPIA NONE DETECTED  ?COCAINSCRNUR NONE DETECTED  ?LABBENZ NONE DETECTED  ?AMPHETMU NONE DETECTED  ?THCU NONE DETECTED  ?LABBARB NONE DETECTED  ?  ?Alcohol Level  ?Recent Labs  ?Lab 02/06/22 ?1208  ?ETH <10  ? ? ?IMAGING past 24 hours ?No results found. ? ?PHYSICAL EXAM ?Physical Exam  ?Constitutional: Appears mildly obese middle-aged lady not in distress.   ?Cardiovascular: Normal rate and regular rhythm.  ?Respiratory: Effort normal, non-labored breathing ?  ?Neuro: ?Mental Status: ?Patient is  awake, alert, oriented to person, place, month, year, and situation. ?Patient is able to give a clear and coherent history. ?No signs of aphasia or neglect ?Cranial Nerves: ?II: Visual Fields are full. Pupils are equal, round, and reactive to light.   ?III,IV, VI: EOMI without ptosis or diploplia.  ?V: Facial sensation is symmetric to temperature ?VII: Facial movement is symmetric resting and smiling ?VIII: Hearing is intact to voice ?X: Palate elevates symmetrically ?XI: Shoulder shrug is symmetric. ?XII: Tongue protrudes midline without atrophy or fasciculations.  ?Motor: ?Tone is normal. Bulk is normal. 5/5 strength was present in all four extremities.  ?Sensory: ?Sensation is symmetric to light touch and temperature in the arms and legs. No extinction to DSS present.  ?Cerebellar: ?Mild right finger-to-nose and knee to heel ataxia.  RAM slowed bilaterally.   ? ?NIHSS 2.  Premorbid modified Rankin score 2 ? ?ASSESSMENT/PLAN ?[name] is a 56 y.o. female with history of hypertension, left pontine infarct, and diabetic retinopathy presenting with weakness in her bilateral lower extremities, right hand s/p fall. She fell towards the right side and could not grasp items  with her right hand.  She endorses a headache and bowel/bladder incontinence.  She denies loss of consciousness, tingling sensations, nausea/vomiting, palpitations, or blurry vision.  MRI shows a progressive increase in the size of the left pontine infarct. ? ?Stroke vs recrudescence of previous infarct:  Increase in size of previous left pointine infarct  likely secondary small vessel disease source ?Code Stroke Head CT- There is 7 mm low-density in the posterior aspect of left side of pons.There is 9 mm low-density in the anterior aspect of left side of pons suggesting another focus of infarct which appears larger in comparison with the infarct seen in the MRI, possibly suggesting increase in size of anterior left pontine infarct. ?MRI  progressive size of the acute left pontine infarct.  Remote right pontine lacunar infarct ?MRA-no emergent finding ?2D Echo  EF 123456, Grade I diastolic dysfunction.  ?LDL 169 ?HgbA1c 12.4 ?VTE prophylaxis - SCDs ?   ?Diet  ? Diet Carb Modified Fluid consistency: Thin; Room service appropriate? Yes  ? ?aspirin 81 mg daily and clopidogrel 75 mg daily prior to admission, now on aspirin 81 mg daily and Brilinta (ticagrelor) 90 mg bid.x 4 weeks and then aspirin alone ?Therapy recommendations: Inpatient rehab disposition: Pending ? ?Hypertension ?Home meds:  None ?Stable ?Permissive hypertension (OK if < 220/120) but gradually normalize in 5-7 days ?Long-term BP goal normotensive ?  ?Hyperlipidemia ?Home meds:  None ?LDL 169, goal < 70 ?Add Atorvastatin 80mg   ?High intensity statin not indicated  ?Continue statin at discharge ?  ?Diabetes type II Uncontrolled ?Home meds:  metformin  ?HgbA1c 12.4, goal < 7.0 ?CBGs ?SSI ? ?Other Stroke Risk Factors ?Acute left pontine infarct likely secondary small vessel disease source- 4/17-4/19 ?Stroke work-up completed-she was started on a statin and DAPT therapy which she states she took as prescribed.  She was discharged home and had to use a cane for balance ? ?Other Active Problems ?No PCP- [facility]22 ? ?Hospital day # 1 ? ? Patient with left pontine lacunar infarct recently with mild residual gait difficulties were discharged a week ago comes in with worsening of his symptoms and repeat MRI shows additional infarct in the pons adjacent to the recent one.  Etiology still likely small vessel disease.  Recommend switch aspirin Plavix to aspirin and Brilinta for 4 weeks followed by aspirin alone and continue aggressive risk factor modification.  Patient has decided not to participate in the sleep smart study.  Recommend transfer to inpatient rehab but patient seems unwilling.  Stroke team will sign off.  Kindly call for questions.  Greater  than 50% time during this 35-minute visit was spent in counseling and coordination of care about lacunar stroke and sleep apnea and discussion about stroke prevention and treatment and answering questions. ? ?Antony Contras, MD ?Medical Director ?[facility] ?Pager: [phone] ?02/08/2022 2:57 PM ? ? ?To contact Stroke Continuity provider, please refer to [email or web address] ?After hours, contact General Neurology ? ?

## 2022-02-08 NOTE — TOC Transition Note (Signed)
Transition of Care (TOC) - CM/SW Discharge Note ? ? ?Patient Details  ?Name: Savannah Conner ?MRN: [id number] ?Date of Birth: Jun 20, 1966 ? ?Transition of Care (TOC) CM/SW Contact:  ?Kermit Balo, RN ?Phone Number: ?02/08/2022, 3:55 PM ? ? ?Clinical Narrative:    ?Patient with recommendations for CIR. Patient has decided to d/c home and return to [place] where her family is located to have the support she needs. She has family at the bedside to provide assistance in going to [place].  ?Outpatient orders for ST/PT/OT provided to the patient. Family will f/u upon return to [place] to find a place for outpatient therapy.  ?Walker to be delivered to the room per [employer] under charity.  ?Brilinta to be delivered to the room per [facility] pharmacy.  ?Patient, sister and family at the bedside aware she needs to have a PcP appt ASAP on return to [place]. They are also aware she needs to find a clinic that will assist with the cost of medications.  ? ? ?Final next level of care: OP Rehab ?Barriers to Discharge: Inadequate or no insurance, Barriers Unresolved (comment) ? ? ?Patient Goals and CMS Choice ?  ?  ?Choice offered to / list presented to : Patient ? ?Discharge Placement ?  ?           ?  ?  ?  ?  ? ?Discharge Plan and Services ?  ?  ?           ?DME Arranged: Walker rolling ?DME Agency: [employer] ?Date DME Agency Contacted: 02/08/22 ?  ?Representative spoke with at DME Agency: [place] ?  ?  ?  ?  ?  ? ?Social Determinants of Health (SDOH) Interventions ?  ? ? ?Readmission Risk Interventions ?   ? View : No data to display.  ?  ?  ?  ? ? ? ? ? ?

## 2022-02-08 NOTE — Progress Notes (Signed)
Occupational Therapy Treatment ?Patient Details ?Name: Savannah Conner ?MRN: [id number] ?DOB: May 12, 1966 ?Today's Date: 02/08/2022 ? ? ?History of present illness Pt is a 56 y/o female admitted following fall and RLE and RUE weakness. Per notes, Stroke vs recrudescence of previous infarct in L pontine area. PMH includes HTN, CVA, and DM. ?  ?OT comments ? This 56 yo female is making progress with use of her RUE but still having coordination issues with it. She did well with the activities I showed her today for her RUE and will continue to benefit from acute OT to work on RUE and balance to facilitate her getting back functional use of RUE and balance for basic ADLs, IADLs, and working so she can return to living on her own.  ? ?Recommendations for follow up therapy are one component of a multi-disciplinary discharge planning process, led by the attending physician.  Recommendations may be updated based on patient status, additional functional criteria and insurance authorization. ?   ?Follow Up Recommendations ? Acute inpatient rehab (3hours/day)  ?  ?Assistance Recommended at Discharge Frequent or constant Supervision/Assistance  ?Patient can return home with the following ? A little help with walking and/or transfers;A little help with bathing/dressing/bathroom;Assistance with cooking/housework;Assistance with feeding;Direct supervision/assist for financial management;Assist for transportation;Help with stairs or ramp for entrance;Direct supervision/assist for medications management ?  ?   ?Recommendations for Other Services Rehab consult ? ?  ?Precautions / Restrictions Precautions ?Precautions: Fall ?Restrictions ?Weight Bearing Restrictions: No  ? ? ?  ? ?Mobility Bed Mobility ?Overal bed mobility: Needs Assistance ?Bed Mobility: Sit to Supine, Supine to Sit ?  ?  ?Supine to sit: Min guard, HOB elevated (use of rail) ?Sit to supine: Min guard ?  ?  ?  ? ?Transfers ?Overall transfer level: Needs  assistance ?Equipment used: 1 person hand held assist ?Transfers: Sit to/from Stand ?Sit to Stand: Min guard ?  ?  ?  ?  ?  ?General transfer comment: side step up towards [facility] with min A (HHA) ?  ?  ?   ? ? ? ? ?Extremity/Trunk Assessment Upper Extremity Assessment ?Upper Extremity Assessment: RUE deficits/detail ?RUE Deficits / Details: Reports arm and hand are working better than yesterday, but still having alot of issues wtih coordination. ?RUE Sensation: decreased light touch;decreased proprioception ?RUE Coordination: decreased fine motor;decreased gross motor ?  ?  ?  ?  ?  ? ?Vision Baseline Vision/History: 1 Wears glasses ?  ?  ?   ?   ? ?Cognition Arousal/Alertness: Awake/alert ?Behavior During Therapy: [facility] for tasks assessed/performed ?Overall Cognitive Status: Within Functional Limits for tasks assessed ?  ?  ?  ?  ?  ?  ?  ?  ?  ?  ?  ?  ?  ?  ?  ?  ?  ?  ?  ?   ?Exercises Other Exercises ?Other Exercises: Pt attempting to eat with RUE with regular fork--per her report doing better but still hard. Provided her with red tubing and this worked better for her. Had her work on opening and closing all of the toiletry items in her room as well as applying and removing red tubing from her spoon. Also work on picking up and putting down two small items (salt and pepper packets) in her hand. Recommended she continue to work on these activities 5-10 minutes of every hour she is awake as well as just using her RUE as much as possible as she would normally try to. Did recommend  she not pick up any hot items in that hand or breakable items. ? ?  ?   ?   ? ? ?Pertinent Vitals/ Pain       Pain Assessment ?Pain Assessment: No/denies pain ? ?   ?   ? ?Frequency ? Min 2X/week  ? ? ? ? ?  ?Progress Toward Goals ? ?OT Goals(current goals can now be found in the care plan section) ? Progress towards OT goals: Progressing toward goals ? ?Acute Rehab OT Goals ?Patient Stated Goal: for RUE to continue to get better ?OT Goal  Formulation: With patient/family ?Time For Goal Achievement: 02/20/22 ?Potential to Achieve Goals: Good  ?Plan Discharge plan remains appropriate   ? ?   ?AM-PAC OT "6 Clicks" Daily Activity     ?Outcome Measure ? ? Help from another person eating meals?: A Little (setup) ?Help from another person taking care of personal grooming?: A Little ?Help from another person toileting, which includes using toliet, bedpan, or urinal?: A Little ?Help from another person bathing (including washing, rinsing, drying)?: A Little ?Help from another person to put on and taking off regular upper body clothing?: A Little ?Help from another person to put on and taking off regular lower body clothing?: A Little ?6 Click Score: 18 ? ?  ?End of Session   ? ?OT Visit Diagnosis: Unsteadiness on feet (R26.81);Muscle weakness (generalized) (M62.81);Hemiplegia and hemiparesis ?Hemiplegia - Right/Left: Right ?Hemiplegia - dominant/non-dominant: Dominant ?Hemiplegia - caused by: Cerebral infarction ?  ?Activity Tolerance Patient tolerated treatment well ?  ?Patient Left in bed;with call bell/phone within reach;with bed alarm set;with family/visitor present (son in from [place]) ?  ?   ? ?   ? ?Time: 1040-1107 ?OT Time Calculation (min): 27 min ? ?Charges: OT General Charges ?$OT Visit: 1 Visit ?OT Treatments ?$Therapeutic Exercise: 23-37 mins ? ?Ignacia Palma, OTR/L ?Acute Rehab Services ?Pager [phone] ?Office [phone] ? ? ? ?Evette Georges ?02/08/2022, 1:07 PM ?

## 2022-02-08 NOTE — Progress Notes (Signed)
IP rehab admissions - I met with patient at the bedside.  Patient tells me that she has been walking to the bathroom without assistance.  Patient lives alone but plans to discharge with her family.  She does not want to admit to CIR because she has no insurance.  Recommend discharge with son/family directly home once patient is medically ready.  Call for questions.  [phone] ?

## 2022-02-08 NOTE — TOC CAGE-AID Note (Signed)
Transition of Care (TOC) - CAGE-AID Screening ? ? ?Patient Details  ?Name: Savannah Conner ?MRN: [id number] ?Date of Birth: Sep 12, 1966 ? ?Transition of Care (TOC) CM/SW Contact:    ?[place], LCSW ?Phone Number: ?02/08/2022, 9:49 AM ? ? ?Clinical Narrative: ? ?CAGE-AID completed; score of 0. Pt denied alcohol or other drug use.  ? ?CAGE-AID Screening: ?  ? ?Have You Ever Felt You Ought to Cut Down on Your Drinking or Drug Use?: No ?Have People Annoyed You By Critizing Your Drinking Or Drug Use?: No ?Have You Felt Bad Or Guilty About Your Drinking Or Drug Use?: No ?Have You Ever Had a Drink or Used Drugs First Thing In The Morning to Steady Your Nerves or to Get Rid of a Hangover?: No ?CAGE-AID Score: 0 ? ?Substance Abuse Education Offered: No ? ?  ? ? ? ? ? ? ?

## 2022-02-08 NOTE — Discharge Summary (Signed)
? ?Name: Savannah Conner ?MRN: [id number] ?DOB: 1966/08/25 56 y.o. ?PCP: Pcp, No ? ?Date of Admission: 02/06/2022 11:52 AM ?Date of Discharge: 02/08/2022  ?Attending Physician: Inez CatalinaMullen, Emily B, MD ? ?Discharge Diagnosis: ?1. Expansion left pontine ischemic infarct ?2. Poorly controlled type 2 diabetes with hyperglycemia ?3. Uninsured ? ?Discharge Medications: ?Allergies as of 02/08/2022   ? ?   Reactions  ? Codeine Other (See Comments)  ? Burns her stomach  ? ?  ? ?  ?Medication List  ?  ? ?STOP taking these medications   ? ?clopidogrel 75 MG tablet ?Commonly known as: PLAVIX ?  ? ?  ? ?TAKE these medications   ? ?aspirin 81 MG EC tablet ?Take 1 tablet (81 mg total) by mouth daily. Swallow whole. ?  ?atorvastatin 80 MG tablet ?Commonly known as: LIPITOR ?Take 1 tablet (80 mg total) by mouth daily. ?  ?insulin glargine 100 UNIT/ML Solostar Pen ?Commonly known as: LANTUS ?Inject 20 Units into the skin at bedtime. ?  ?Insulin Pen Needle 32G X 4 MM Misc ?Use to inject insulin up to 4 times daily as directed. ?  ?ticagrelor 90 MG Tabs tablet ?Commonly known as: BRILINTA ?Take 1 tablet (90 mg total) by mouth 2 (two) times daily. ?  ? ?  ? ?  ?  ? ? ?  ?Durable Medical Equipment  ?(From admission, onward)  ?  ? ? ?  ? ?  Start     Ordered  ? 02/08/22 1509  For home use only DME Walker rolling  Once       ?Question Answer Comment  ?Walker: With 5 Inch Wheels   ?Patient needs a walker to treat with the following condition Stroke [facility](HCC)   ?  ? 02/08/22 1508  ? ?  ?  ? ?  ? ? ?Disposition and follow-up:   ?[name] St[facility].  At the hospital follow up visit please address: ? ?Expansion left pontine ischemic infarct: Discharged on 4 weeks of Brilinta and aspirin followed by aspirin monotherapy.  Patient will need to follow-up with neurology in [place] after she moves back home. ?Poorly controlled type 2 diabetes with hyperglycemia: Patient discharged on Lantus 20  units nightly.  Will need follow-up with PCP to continue to optimize diabetes regimen.  Patient is lack of insurance and difficulties affording medications is a barrier. ? ?Labs / imaging needed at time of follow-up: CBGs ? ?Pending labs/ test needing follow-up: None ? ?Follow-up Appointments: ?Patient will be traveling to [place].  She will need to arrange for new primary care provider and for neurology follow-up in [place]. ? ?Hospital Course by Problem List: ? ?Savannah Conner is a 56 y.o. female with PMH uncontrolled DM2, HLD, with recent hx of L pontine stroke, who presented to [facility] (02/06/2022) for RHB weakness and slurred speech and admitted for stroke extension work-up. ? ?#Expansion left pontine ischemic infarct ?[facility] following right hemibody weakness and slurred speech at home.  Neurology was consulted for code stroke in the ED.  MRI brain showed left pontine stroke that was noted on 4/17 has expanded.  Etiology small vessel disease.  Patient does have risk factors including poorly controlled type 2 diabetes and hyperlipidemia.  On exam patient had 4/5 right upper extremity and right lower extremity weakness with mild aphasia.  The following morning patient did have improvement in weakness, R grip 5/5, R elbow flexors and extensors 4/5, R hip flexors  4/5, R knee extension and flexion 5/5, R dorsiflexion and plantarflexion 5/5.  Left hemibody strength 5/5.  Patient continues to have difficulties with word finding.  Patient was continued on DAPT and Plavix was switched to Brilinta 90 mg twice daily.  Patient initially skilled for CIR, however patient is working in [place] and visiting from [place].  She wishes to return to [place] to stay with family instead of inpatient rehab.  Referrals obtained for PT/OT/SLP for patient to bring to [place].  Patient will discharge home.  She was discharged with 30-day refills of her medications with instructions to take aspirin and Brilinta for  4 weeks followed by aspirin monotherapy. ? ?#Poorly controlled type 2 diabetes with hyperglycemia ?#Diabetic retinopathy ?A1c of 12.4% April 2023.  Patient has been without a PCP since 2021 and is working on obtaining health insurance to afford her medications.  During her last admission, she was discharged on Lantus 20 units nightly.  Patient continued on Lantus 20 units nightly at discharge.  Since she is returning home to [place], patient will need to find new primary care physician for hospital follow-up appointment, to refill medications, and to optimize her diabetic regimen. ? ?#Hyperlipidemia ?Patient continued on home Lipitor. ? ?Subjective on day of discharge: Updated patient on recommendation for IPR.  Patient expresses wish to return home to [place] and live with family where she will have the motivation and support to recover.  Will discuss with case management about obtaining referrals for therapies.  Patient amenable to discharge today if able to. ? ?Discharge Exam:   ?BP 136/63 (BP Location: Left Arm)   Pulse 81   Temp 98.2 ?F (36.8 ?C) (Oral)   Resp 16   Ht 5\' 2"  (1.575 m)   Wt 73.5 kg   SpO2 95%   BMI 29.64 kg/m?  ?Discharge exam: ?General: Well appearing obese Hispanic female, NAD ?HENT: normocephalic, atraumatic, external ears and nares appear normal ?EYES: conjunctiva non-erythematous, no scleral icterus ?CV: regular rate, normal rhythm, no murmurs, rubs, gallops. ?Pulmonary: normal work of breathing on RA, lungs clear to auscultation, no rales, wheezes, rhonchi ?Abdominal: non-distended, soft, non-tender to palpation, normal BS ?Skin: Warm and dry, no rashes or lesions ?Neurological: ?MS: awake, alert and oriented x3, very word finding difficulty, normal fund of knowledge, naming appropriately, repetition intact ?Cranial Nerves: ?II: Pupils equal, round, and reactive to light.  ?III,IV, VI: EOMI all cardinal directions, without ptosis or diploplia.  ?V: Facial sensation is symmetric  ?VII:  Face appears symmetric.  ?VIII: Hearing is intact to voice ?X: Uvula and palate elevate symmetrically ?XI: Shoulder shrug is symmetric. ?XII: Tongue is midline without atrophy or fasciculations.  ?Motor: Left upper and left lower extremity 5/5, R grip 5/5, R elbow flexion and extension 4/5, right hip flexor 4/5, right knee extension and flexion 5/5, right plantar and dorsiflexion 5/5 ?Sensory: Sensation is grossly intact bilateral UEs & LEs ?Coordination: RAM and HTS intact bilaterally ?Psych: normal affect ? ? ?Pertinent Labs, Studies, and Procedures:  ? ?  Latest Ref Rng & Units 02/07/2022  ?  2:51 AM 02/06/2022  ? 12:24 PM 02/06/2022  ? 12:08 PM  ?CBC  ?WBC 4.0 - 10.5 K/uL 8.6    10.0    ?Hemoglobin 12.0 - 15.0 g/dL 02/08/2022   84.6   96.2    ?Hematocrit 36.0 - 46.0 % 37.6   39.0   40.2    ?Platelets 150 - 400 K/uL 266    271    ? ? ?  Latest Ref Rng & Units 02/07/2022  ?  2:51 AM 02/06/2022  ? 12:24 PM 02/06/2022  ? 12:08 PM  ?CMP  ?Glucose 70 - 99 mg/dL  878   676    ?BUN 6 - 20 mg/dL  13   12    ?Creatinine 0.44 - 1.00 mg/dL 7.20   9.47   0.96    ?Sodium 135 - 145 mmol/L  139   137    ?Potassium 3.5 - 5.1 mmol/L  3.9   3.8    ?Chloride 98 - 111 mmol/L  102   103    ?CO2 22 - 32 mmol/L   24    ?Calcium 8.9 - 10.3 mg/dL   9.0    ?Total Protein 6.5 - 8.1 g/dL   6.9    ?Total Bilirubin 0.3 - 1.2 mg/dL   0.8    ?Alkaline Phos 38 - 126 U/L   64    ?AST 15 - 41 U/L   16    ?ALT 0 - 44 U/L   21    ? ? ?CT HEAD WO CONTRAST ? ?Result Date: 02/06/2022 ?CLINICAL DATA:  Headaches, neurological deficit, weakness in the right arm and right lower extremity, fall EXAM: CT HEAD WITHOUT CONTRAST TECHNIQUE: Contiguous axial images were obtained from the base of the skull through the vertex without intravenous contrast. RADIATION DOSE REDUCTION: This exam was performed according to the departmental dose-optimization program which includes automated exposure control, adjustment of the mA and/or kV according to patient size and/or use of  iterative reconstruction technique. COMPARISON:  01/29/2022 FINDINGS: Brain: There are no signs of bleeding within the cranium. Ventricles are not dilated. There is no shift of midline structures. Ventricles are not

## 2022-02-08 NOTE — Progress Notes (Signed)
Physical Therapy Treatment ?Patient Details ?Name: Savannah Conner ?MRN: [id number] ?DOB: 03/10/1966 ?Today's Date: 02/08/2022 ? ? ?History of Present Illness 56 y/o female admitted 4/25 following fall and RLE and RUE weakness. Per notes, Stroke vs recrudescence of previous infarct in [place] area. PMH includes HTN, CVA, and DM. ? ?  ?PT Comments  ? ? Pt was seen with her son available for session and pt initially crying and unhappy.  Talked with her about the plan for therapy with gait and then exercises.  At the end of the session had dc planner in to discuss impending trip to [place].  Pt is going to stay with her family there to have the support she needs.  Follow up including outpatient therapy, RW and her family will see about wc to get her down the long sidewalk to the elevator in the apt's where she is going to live.  Follow for acute PT goals, and continue to encourage pt to ask for help with gait.  She has been trying to walk without help in her room.  When PT returned to her room with pt, reminded her not to drop the walker before getting to the bed to sit safely.  Very ataxic with gait, unsafe to walk alone and without a device.   ?Recommendations for follow up therapy are one component of a multi-disciplinary discharge planning process, led by the attending physician.  Recommendations may be updated based on patient status, additional functional criteria and insurance authorization. ? ?Follow Up Recommendations ? Acute inpatient rehab (3hours/day) ?  ?  ?Assistance Recommended at Discharge Frequent or constant Supervision/Assistance  ?Patient can return home with the following A little help with walking and/or transfers;A little help with bathing/dressing/bathroom;Assistance with cooking/housework;Assist for transportation;Help with stairs or ramp for entrance ?  ?Equipment Recommendations ? Rolling walker (2 wheels)  ?  ?Recommendations for Other Services   ? ? ?  ?Precautions / Restrictions  Precautions ?Precautions: Fall ?Precaution Comments: use RW and assistance ?Restrictions ?Weight Bearing Restrictions: No  ?  ? ?Mobility ? Bed Mobility ?Overal bed mobility: Needs Assistance ?Bed Mobility: Supine to Sit, Sit to Supine ?  ?  ?Supine to sit: Supervision ?Sit to supine: Supervision ?  ?  ?  ? ?Transfers ?Overall transfer level: Needs assistance ?Equipment used: 1 person hand held assist ?Transfers: Sit to/from Stand ?Sit to Stand: Min guard ?  ?  ?  ?  ?  ?  ?  ? ?Ambulation/Gait ?Ambulation/Gait assistance: Min guard, Min assist ?Gait Distance (Feet): 120 Feet ?Assistive device: Rolling walker (2 wheels) ?Gait Pattern/deviations: Step-through pattern, Decreased stride length, Wide base of support ?Gait velocity: Decreased ?Gait velocity interpretation: <1.31 ft/sec, indicative of household ambulator ?Pre-gait activities: standing balance on RW, observation of stability using R hand ?General Gait Details: unsteady but also allowing walker to get too far away from her ? ? ?Stairs ?  ?  ?  ?  ?  ? ? ?Wheelchair Mobility ?  ? ?Modified Rankin (Stroke Patients Only) ?  ? ? ?  ?Balance Overall balance assessment: Needs assistance ?Sitting-balance support: Single extremity supported, Feet supported ?Sitting balance-Leahy Scale: Good ?  ?  ?Standing balance support: Bilateral upper extremity supported, During functional activity ?Standing balance-Leahy Scale: Poor ?  ?  ?  ?  ?  ?  ?  ?  ?  ?  ?  ?  ?  ? ?  ?Cognition Arousal/Alertness: Awake/alert ?Behavior During Therapy: [facility] for tasks assessed/performed, Impulsive ?Overall Cognitive Status:  Within Functional Limits for tasks assessed ?  ?  ?  ?  ?  ?  ?  ?  ?  ?  ?  ?  ?  ?  ?  ?  ?General Comments: cued keeping walker close and assisted with pants that were sliding ?  ?  ? ?  ?Exercises General Exercises - Lower Extremity ?Ankle Circles/Pumps: AAROM, 5 reps ?Quad Sets: AROM, 10 reps ?Gluteal Sets: AROM, 10 reps ?Long Arc Quad: AROM, 10 reps ?Heel Slides:  AROM, 10 reps ?Hip ABduction/ADduction: AROM, 10 reps ? ?  ?General Comments General comments (skin integrity, edema, etc.): pt is up to walk and discussed her motivation to go to [place] with family support.  Discussed equipment needs for home, recommended a wc for initial management of staircase.  Pt has to go down the sidewalk to get there.  Will have her family look for a donation vs thrift shop one to get to elevator at apt where she is going. ?  ?  ? ?Pertinent Vitals/Pain Pain Assessment ?Pain Assessment: No/denies pain  ? ? ?Home Living   ?  ?  ?  ?  ?  ?  ?  ?  ?  ?   ?  ?Prior Function    ?  ?  ?   ? ?PT Goals (current goals can now be found in the care plan section) Acute Rehab PT Goals ?Patient Stated Goal: to walk alone again ?Progress towards PT goals: Progressing toward goals ? ?  ?Frequency ? ? ? Min 4X/week ? ? ? ?  ?PT Plan Current plan remains appropriate  ? ? ?Co-evaluation   ?  ?  ?  ?  ? ?  ?AM-PAC PT "6 Clicks" Mobility   ?Outcome Measure ? Help needed turning from your back to your side while in a flat bed without using bedrails?: A Little ?Help needed moving from lying on your back to sitting on the side of a flat bed without using bedrails?: A Little ?Help needed moving to and from a bed to a chair (including a wheelchair)?: A Little ?Help needed standing up from a chair using your arms (e.g., wheelchair or bedside chair)?: A Little ?Help needed to walk in hospital room?: A Little ?Help needed climbing 3-5 steps with a railing? : Total ?6 Click Score: 16 ? ?  ?End of Session Equipment Utilized During Treatment: Gait belt ?Activity Tolerance: Patient tolerated treatment well ?Patient left: in bed;with call bell/phone within reach;with bed alarm set;with family/visitor present ?Nurse Communication: Mobility status ?PT Visit Diagnosis: Unsteadiness on feet (R26.81);Muscle weakness (generalized) (M62.81);Ataxic gait (R26.0) ?  ? ? ?Time: [id number] ?PT Time Calculation (min) (ACUTE ONLY): 24  min ? ?Charges:  $Gait Training: 8-22 mins ?$Therapeutic Exercise: 8-22 mins  ?Ramond Dial ?02/08/2022, 3:37 PM ? ?Mee Hives, PT PhD ?Acute Rehab Dept. Number: [facility] [id number] and [place] [phone] ? ? ?

## 2022-02-09 ENCOUNTER — Encounter: Payer: Self-pay | Admitting: Student

## 2022-02-20 ENCOUNTER — Other Ambulatory Visit (HOSPITAL_COMMUNITY): Payer: Self-pay

## 2022-02-20 ENCOUNTER — Telehealth (HOSPITAL_COMMUNITY): Payer: Self-pay | Admitting: Pharmacist

## 2022-02-20 NOTE — Telephone Encounter (Signed)
Transitions of Care Pharmacy  ° °Call attempted for a pharmacy transitions of care follow-up. HIPAA appropriate voicemail was left with call back information provided.  ° °Call attempt #1. Will follow-up in 2-3 days.  °  °

## 2022-02-21 ENCOUNTER — Other Ambulatory Visit (HOSPITAL_COMMUNITY): Payer: Self-pay

## 2022-02-21 ENCOUNTER — Telehealth (HOSPITAL_COMMUNITY): Payer: Self-pay | Admitting: Pharmacist

## 2022-02-21 NOTE — Telephone Encounter (Signed)
Transitions of Care Pharmacy   Call attempted for a pharmacy transitions of care follow-up. HIPAA appropriate voicemail was left with call back information provided.   Call attempt #2. Will follow-up in 2-3 days.    

## 2022-03-06 ENCOUNTER — Other Ambulatory Visit (HOSPITAL_COMMUNITY): Payer: Self-pay

## 2022-03-06 ENCOUNTER — Telehealth (HOSPITAL_COMMUNITY): Payer: Self-pay | Admitting: Pharmacist

## 2022-03-06 NOTE — Telephone Encounter (Signed)
Transitions of Care Pharmacy  ° °Call attempted for a pharmacy transitions of care follow-up. HIPAA appropriate voicemail was left with call back information provided.  ° °Call attempt #3. No further follow-up necessary ° ° °

## 2022-03-26 ENCOUNTER — Encounter: Payer: Self-pay | Admitting: Infectious Diseases
